# Patient Record
Sex: Female | Born: 1960 | Race: Black or African American | Hispanic: No | Smoking: Former smoker
Health system: Southern US, Community
[De-identification: ages and names within clinical notes are randomized; demographics above are authoritative.]

## PROBLEM LIST (undated history)

## (undated) DIAGNOSIS — I10 Essential (primary) hypertension: Secondary | ICD-10-CM

---

## 2014-04-21 ENCOUNTER — Emergency Department (HOSPITAL_COMMUNITY): Payer: Medicaid Other

## 2014-04-21 ENCOUNTER — Inpatient Hospital Stay (HOSPITAL_COMMUNITY): Payer: Medicaid Other

## 2014-04-21 ENCOUNTER — Encounter (HOSPITAL_COMMUNITY): Payer: Self-pay | Admitting: Pulmonary Disease

## 2014-04-21 ENCOUNTER — Inpatient Hospital Stay (HOSPITAL_COMMUNITY)
Admission: EM | Admit: 2014-04-21 | Discharge: 2014-05-21 | DRG: 064 | Disposition: E | Payer: Medicaid Other | Attending: Neurology | Admitting: Neurology

## 2014-04-21 DIAGNOSIS — A419 Sepsis, unspecified organism: Secondary | ICD-10-CM | POA: Diagnosis not present

## 2014-04-21 DIAGNOSIS — G911 Obstructive hydrocephalus: Secondary | ICD-10-CM | POA: Diagnosis present

## 2014-04-21 DIAGNOSIS — J9600 Acute respiratory failure, unspecified whether with hypoxia or hypercapnia: Secondary | ICD-10-CM | POA: Diagnosis not present

## 2014-04-21 DIAGNOSIS — G936 Cerebral edema: Secondary | ICD-10-CM | POA: Diagnosis present

## 2014-04-21 DIAGNOSIS — Z515 Encounter for palliative care: Secondary | ICD-10-CM

## 2014-04-21 DIAGNOSIS — I619 Nontraumatic intracerebral hemorrhage, unspecified: Secondary | ICD-10-CM | POA: Diagnosis present

## 2014-04-21 DIAGNOSIS — E870 Hyperosmolality and hypernatremia: Secondary | ICD-10-CM | POA: Diagnosis not present

## 2014-04-21 DIAGNOSIS — G9389 Other specified disorders of brain: Secondary | ICD-10-CM | POA: Diagnosis present

## 2014-04-21 DIAGNOSIS — Z87891 Personal history of nicotine dependence: Secondary | ICD-10-CM | POA: Diagnosis not present

## 2014-04-21 DIAGNOSIS — R1310 Dysphagia, unspecified: Secondary | ICD-10-CM | POA: Diagnosis present

## 2014-04-21 DIAGNOSIS — R29898 Other symptoms and signs involving the musculoskeletal system: Secondary | ICD-10-CM | POA: Diagnosis present

## 2014-04-21 DIAGNOSIS — R000 Tachycardia, unspecified: Secondary | ICD-10-CM | POA: Diagnosis present

## 2014-04-21 DIAGNOSIS — Z66 Do not resuscitate: Secondary | ICD-10-CM | POA: Diagnosis not present

## 2014-04-21 DIAGNOSIS — G935 Compression of brain: Secondary | ICD-10-CM | POA: Diagnosis present

## 2014-04-21 DIAGNOSIS — R6520 Severe sepsis without septic shock: Secondary | ICD-10-CM

## 2014-04-21 DIAGNOSIS — R7309 Other abnormal glucose: Secondary | ICD-10-CM | POA: Diagnosis present

## 2014-04-21 DIAGNOSIS — I161 Hypertensive emergency: Secondary | ICD-10-CM

## 2014-04-21 DIAGNOSIS — I10 Essential (primary) hypertension: Secondary | ICD-10-CM | POA: Diagnosis present

## 2014-04-21 DIAGNOSIS — J690 Pneumonitis due to inhalation of food and vomit: Secondary | ICD-10-CM | POA: Diagnosis not present

## 2014-04-21 DIAGNOSIS — R569 Unspecified convulsions: Secondary | ICD-10-CM | POA: Diagnosis present

## 2014-04-21 DIAGNOSIS — R4020 Unspecified coma: Secondary | ICD-10-CM | POA: Diagnosis not present

## 2014-04-21 HISTORY — DX: Essential (primary) hypertension: I10

## 2014-04-21 LAB — I-STAT CHEM 8, ED
BUN: 28 mg/dL — ABNORMAL HIGH (ref 6–23)
CALCIUM ION: 1.38 mmol/L — AB (ref 1.12–1.23)
CREATININE: 1.1 mg/dL (ref 0.50–1.10)
Chloride: 107 mEq/L (ref 96–112)
Glucose, Bld: 200 mg/dL — ABNORMAL HIGH (ref 70–99)
HCT: 46 % (ref 36.0–46.0)
HEMOGLOBIN: 15.6 g/dL — AB (ref 12.0–15.0)
Potassium: 4.2 mEq/L (ref 3.7–5.3)
SODIUM: 142 meq/L (ref 137–147)
TCO2: 21 mmol/L (ref 0–100)

## 2014-04-21 LAB — POCT I-STAT 3, ART BLOOD GAS (G3+)
ACID-BASE DEFICIT: 3 mmol/L — AB (ref 0.0–2.0)
Bicarbonate: 22.8 mEq/L (ref 20.0–24.0)
O2 Saturation: 96 %
PO2 ART: 83 mmHg (ref 80.0–100.0)
Patient temperature: 98.6
TCO2: 24 mmol/L (ref 0–100)
pCO2 arterial: 40.9 mmHg (ref 35.0–45.0)
pH, Arterial: 7.353 (ref 7.350–7.450)

## 2014-04-21 LAB — GLUCOSE, CAPILLARY
GLUCOSE-CAPILLARY: 148 mg/dL — AB (ref 70–99)
Glucose-Capillary: 130 mg/dL — ABNORMAL HIGH (ref 70–99)

## 2014-04-21 LAB — URINE MICROSCOPIC-ADD ON

## 2014-04-21 LAB — URINALYSIS, ROUTINE W REFLEX MICROSCOPIC
Bilirubin Urine: NEGATIVE
Glucose, UA: NEGATIVE mg/dL
Ketones, ur: NEGATIVE mg/dL
LEUKOCYTES UA: NEGATIVE
Nitrite: NEGATIVE
PH: 6 (ref 5.0–8.0)
Protein, ur: >300 mg/dL — AB
SPECIFIC GRAVITY, URINE: 1.031 — AB (ref 1.005–1.030)
UROBILINOGEN UA: 0.2 mg/dL (ref 0.0–1.0)

## 2014-04-21 LAB — APTT: aPTT: 24 seconds (ref 24–37)

## 2014-04-21 LAB — CBC
HEMATOCRIT: 41.3 % (ref 36.0–46.0)
HEMOGLOBIN: 13.4 g/dL (ref 12.0–15.0)
MCH: 24.4 pg — AB (ref 26.0–34.0)
MCHC: 32.4 g/dL (ref 30.0–36.0)
MCV: 75.2 fL — ABNORMAL LOW (ref 78.0–100.0)
Platelets: 345 10*3/uL (ref 150–400)
RBC: 5.49 MIL/uL — ABNORMAL HIGH (ref 3.87–5.11)
RDW: 20.9 % — ABNORMAL HIGH (ref 11.5–15.5)
WBC: 14.1 10*3/uL — ABNORMAL HIGH (ref 4.0–10.5)

## 2014-04-21 LAB — COMPREHENSIVE METABOLIC PANEL
ALT: 16 U/L (ref 0–35)
AST: 29 U/L (ref 0–37)
Albumin: 3.5 g/dL (ref 3.5–5.2)
Alkaline Phosphatase: 106 U/L (ref 39–117)
BILIRUBIN TOTAL: 0.3 mg/dL (ref 0.3–1.2)
BUN: 20 mg/dL (ref 6–23)
CALCIUM: 10.6 mg/dL — AB (ref 8.4–10.5)
CHLORIDE: 101 meq/L (ref 96–112)
CO2: 17 mEq/L — ABNORMAL LOW (ref 19–32)
CREATININE: 0.78 mg/dL (ref 0.50–1.10)
GFR calc non Af Amer: >90 mL/min (ref >90)
GLUCOSE: 165 mg/dL — AB (ref 70–99)
Potassium: 2.9 mEq/L — CL (ref 3.7–5.3)
Sodium: 141 mEq/L (ref 137–147)
Total Protein: 8.8 g/dL — ABNORMAL HIGH (ref 6.0–8.3)

## 2014-04-21 LAB — CBG MONITORING, ED: GLUCOSE-CAPILLARY: 163 mg/dL — AB (ref 70–99)

## 2014-04-21 LAB — DIFFERENTIAL
BASOS PCT: 0 % (ref 0–1)
Basophils Absolute: 0 10*3/uL (ref 0.0–0.1)
EOS ABS: 0.6 10*3/uL (ref 0.0–0.7)
Eosinophils Relative: 4 % (ref 0–5)
Lymphocytes Relative: 50 % — ABNORMAL HIGH (ref 12–46)
Lymphs Abs: 7 10*3/uL — ABNORMAL HIGH (ref 0.7–4.0)
MONO ABS: 1.1 10*3/uL — AB (ref 0.1–1.0)
Monocytes Relative: 8 % (ref 3–12)
Neutro Abs: 5.4 10*3/uL (ref 1.7–7.7)
Neutrophils Relative %: 38 % — ABNORMAL LOW (ref 43–77)

## 2014-04-21 LAB — I-STAT TROPONIN, ED
Troponin i, poc: 0.01 ng/mL (ref 0.00–0.08)
Troponin i, poc: 0.02 ng/mL (ref 0.00–0.08)

## 2014-04-21 LAB — PROTIME-INR
INR: 0.97 (ref 0.00–1.49)
Prothrombin Time: 12.7 seconds (ref 11.6–15.2)

## 2014-04-21 LAB — MRSA PCR SCREENING: MRSA BY PCR: NEGATIVE

## 2014-04-21 MED ORDER — NICARDIPINE HCL IN NACL 20-0.86 MG/200ML-% IV SOLN
5.0000 mg/h | Freq: Once | INTRAVENOUS | Status: AC
  Administered 2014-04-21: 5 mg/h via INTRAVENOUS

## 2014-04-21 MED ORDER — ACETAMINOPHEN 650 MG RE SUPP
650.0000 mg | RECTAL | Status: DC | PRN
  Administered 2014-04-23: 650 mg via RECTAL
  Filled 2014-04-21: qty 1

## 2014-04-21 MED ORDER — BIOTENE DRY MOUTH MT LIQD
15.0000 mL | Freq: Four times a day (QID) | OROMUCOSAL | Status: DC
  Administered 2014-04-22 – 2014-04-25 (×16): 15 mL via OROMUCOSAL

## 2014-04-21 MED ORDER — NICARDIPINE HCL IN NACL 40-0.83 MG/200ML-% IV SOLN
3.0000 mg/h | INTRAVENOUS | Status: DC
  Administered 2014-04-21: 15 mg/h via INTRAVENOUS
  Administered 2014-04-22: 10 mg/h via INTRAVENOUS
  Administered 2014-04-22: 9 mg/h via INTRAVENOUS
  Administered 2014-04-22: 10 mg/h via INTRAVENOUS
  Administered 2014-04-22: 6 mg/h via INTRAVENOUS
  Administered 2014-04-23: 11 mg/h via INTRAVENOUS
  Administered 2014-04-23: 3 mg/h via INTRAVENOUS
  Administered 2014-04-23: 15 mg/h via INTRAVENOUS
  Administered 2014-04-23: 13 mg/h via INTRAVENOUS
  Administered 2014-04-24: 7.5 mg/h via INTRAVENOUS
  Administered 2014-04-24: 7 mg/h via INTRAVENOUS
  Administered 2014-04-24 (×2): 15 mg/h via INTRAVENOUS
  Filled 2014-04-21 (×14): qty 200

## 2014-04-21 MED ORDER — LORAZEPAM 2 MG/ML IJ SOLN
INTRAMUSCULAR | Status: AC | PRN
  Administered 2014-04-21 (×2): 2 mg via INTRAVENOUS

## 2014-04-21 MED ORDER — ETOMIDATE 2 MG/ML IV SOLN: 20.0000 mg | Freq: Once | INTRAVENOUS | Status: DC

## 2014-04-21 MED ORDER — LABETALOL HCL 5 MG/ML IV SOLN: 10.0000 mg | INTRAVENOUS | Status: DC | PRN

## 2014-04-21 MED ORDER — SODIUM CHLORIDE 0.9 % IV SOLN
500.0000 mg | Freq: Two times a day (BID) | INTRAVENOUS | Status: DC
  Administered 2014-04-21 – 2014-04-22 (×2): 500 mg via INTRAVENOUS
  Filled 2014-04-21 (×4): qty 5

## 2014-04-21 MED ORDER — PROPOFOL 10 MG/ML IV EMUL
5.0000 ug/kg/min | Freq: Once | INTRAVENOUS | Status: DC
  Filled 2014-04-21: qty 100

## 2014-04-21 MED ORDER — SUCCINYLCHOLINE CHLORIDE 20 MG/ML IJ SOLN
100.0000 mg | Freq: Once | INTRAMUSCULAR | Status: DC
  Filled 2014-04-21: qty 5

## 2014-04-21 MED ORDER — LABETALOL HCL 5 MG/ML IV SOLN
10.0000 mg | INTRAVENOUS | Status: DC | PRN
  Administered 2014-04-23 – 2014-04-24 (×2): 20 mg via INTRAVENOUS
  Filled 2014-04-21 (×2): qty 4

## 2014-04-21 MED ORDER — NICARDIPINE HCL IN NACL 20-0.86 MG/200ML-% IV SOLN
3.0000 mg/h | INTRAVENOUS | Status: DC
  Administered 2014-04-21 (×2): 15 mg/h via INTRAVENOUS
  Filled 2014-04-21 (×2): qty 200

## 2014-04-21 MED ORDER — METOPROLOL TARTRATE 1 MG/ML IV SOLN
2.5000 mg | INTRAVENOUS | Status: DC | PRN
  Administered 2014-04-21: 2.5 mg via INTRAVENOUS
  Administered 2014-04-21: 5 mg via INTRAVENOUS
  Administered 2014-04-21: 2.5 mg via INTRAVENOUS
  Administered 2014-04-22 – 2014-04-24 (×11): 5 mg via INTRAVENOUS
  Filled 2014-04-21 (×13): qty 5

## 2014-04-21 MED ORDER — PROPOFOL 10 MG/ML IV EMUL
INTRAVENOUS | Status: AC
  Filled 2014-04-21: qty 100

## 2014-04-21 MED ORDER — PROPOFOL 10 MG/ML IV BOLUS: 50.0000 mg | Freq: Once | INTRAVENOUS | Status: DC

## 2014-04-21 MED ORDER — ETOMIDATE 2 MG/ML IV SOLN
INTRAVENOUS | Status: AC
  Administered 2014-04-21: 20 mg
  Filled 2014-04-21: qty 20

## 2014-04-21 MED ORDER — FENTANYL CITRATE 0.05 MG/ML IJ SOLN
INTRAMUSCULAR | Status: AC
  Filled 2014-04-21: qty 2

## 2014-04-21 MED ORDER — ROCURONIUM BROMIDE 50 MG/5ML IV SOLN
INTRAVENOUS | Status: AC
  Filled 2014-04-21: qty 2

## 2014-04-21 MED ORDER — PANTOPRAZOLE SODIUM 40 MG IV SOLR
40.0000 mg | Freq: Every day | INTRAVENOUS | Status: DC
  Administered 2014-04-21: 40 mg via INTRAVENOUS
  Filled 2014-04-21 (×2): qty 40

## 2014-04-21 MED ORDER — ACETAMINOPHEN 325 MG PO TABS
650.0000 mg | ORAL_TABLET | ORAL | Status: DC | PRN
  Administered 2014-04-24 – 2014-04-25 (×2): 650 mg via ORAL
  Filled 2014-04-21 (×2): qty 2

## 2014-04-21 MED ORDER — LIDOCAINE HCL (CARDIAC) 20 MG/ML IV SOLN
INTRAVENOUS | Status: AC
  Filled 2014-04-21: qty 5

## 2014-04-21 MED ORDER — HYDRALAZINE HCL 20 MG/ML IJ SOLN: 10.0000 mg | INTRAMUSCULAR | Status: DC | PRN

## 2014-04-21 MED ORDER — SODIUM CHLORIDE 0.9 % IV SOLN
INTRAVENOUS | Status: DC
  Administered 2014-04-21 – 2014-04-23 (×5): via INTRAVENOUS

## 2014-04-21 MED ORDER — SUCCINYLCHOLINE CHLORIDE 20 MG/ML IJ SOLN
INTRAMUSCULAR | Status: AC
  Administered 2014-04-21: 20 mg
  Filled 2014-04-21: qty 1

## 2014-04-21 MED ORDER — FAMOTIDINE IN NACL 20-0.9 MG/50ML-% IV SOLN
20.0000 mg | Freq: Two times a day (BID) | INTRAVENOUS | Status: DC
  Administered 2014-04-21 – 2014-04-25 (×8): 20 mg via INTRAVENOUS
  Filled 2014-04-21 (×10): qty 50

## 2014-04-21 MED ORDER — INSULIN ASPART 100 UNIT/ML ~~LOC~~ SOLN
0.0000 [IU] | SUBCUTANEOUS | Status: DC
Start: 2014-04-21 — End: 2014-04-25
  Administered 2014-04-21 – 2014-04-22 (×3): 1 [IU] via SUBCUTANEOUS
  Administered 2014-04-22 (×2): 2 [IU] via SUBCUTANEOUS
  Administered 2014-04-22 – 2014-04-24 (×11): 1 [IU] via SUBCUTANEOUS
  Administered 2014-04-24: 2 [IU] via SUBCUTANEOUS
  Administered 2014-04-24: 1 [IU] via SUBCUTANEOUS
  Administered 2014-04-24 – 2014-04-25 (×2): 2 [IU] via SUBCUTANEOUS
  Administered 2014-04-25 (×2): 1 [IU] via SUBCUTANEOUS

## 2014-04-21 MED ORDER — CHLORHEXIDINE GLUCONATE 0.12 % MT SOLN
15.0000 mL | Freq: Two times a day (BID) | OROMUCOSAL | Status: DC
  Administered 2014-04-21 – 2014-04-25 (×8): 15 mL via OROMUCOSAL
  Filled 2014-04-21 (×8): qty 15

## 2014-04-21 MED ORDER — NICARDIPINE HCL IN NACL 20-0.86 MG/200ML-% IV SOLN
5.0000 mg/h | Freq: Once | INTRAVENOUS | Status: AC
  Administered 2014-04-21: 12.5 mg/h via INTRAVENOUS
  Filled 2014-04-21: qty 200

## 2014-04-21 MED ORDER — PROPOFOL 10 MG/ML IV EMUL
5.0000 ug/kg/min | INTRAVENOUS | Status: DC
  Administered 2014-04-21: 40 ug/kg/min via INTRAVENOUS
  Administered 2014-04-21 (×2): 70 ug/kg/min via INTRAVENOUS
  Administered 2014-04-22: 40 ug/kg/min via INTRAVENOUS
  Filled 2014-04-21 (×3): qty 100

## 2014-04-21 MED ORDER — LORAZEPAM 2 MG/ML IJ SOLN
INTRAMUSCULAR | Status: AC
  Filled 2014-04-21: qty 1

## 2014-04-21 MED ORDER — FENTANYL CITRATE 0.05 MG/ML IJ SOLN
25.0000 ug | INTRAMUSCULAR | Status: DC | PRN
  Administered 2014-04-21 – 2014-04-24 (×16): 100 ug via INTRAVENOUS
  Filled 2014-04-21 (×15): qty 2

## 2014-04-21 NOTE — Consult Note (Signed)
Reason for Consult: ICH/IVH Referring Physician: EDP/ neurologist  Courtney Greene is an 53 y.o. female.   HPI:  This is a 53 year old African American female with a history of hypertension who was brought to the emergency department today with mental status changes and right-sided weakness. 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 She is sedated and intubated and unable to cooperate with history and physical. I was consulted about potential ventriculostomy placement for IVH.  Past Medical History  Diagnosis Date  . HTN (hypertension)     History reviewed. No pertinent past surgical history.  No Known Allergies  History  Substance Use Topics  . Smoking status: Former Smoker    Quit date: 12/22/1983  . Smokeless tobacco: Not on file  . Alcohol Use: Not on file    History reviewed. No pertinent family history.   Review of Systems  Positive ROS: Unable to obtain  All other systems have been reviewed and were otherwise negative with the exception of those mentioned in the HPI and as above.  Objective: Vital signs in last 24 hours: Pulse Rate:  [132-151] 148 (05/02 1535) Resp:  [19-26] 23 (05/02 1535) BP: (190-227)/(105-198) 190/111 mmHg (05/02 1535) SpO2:  [100 %] 100 % (05/02 1535) FiO2 (%):  [40 %-100 %] 40 % (05/02 1612) Weight:  [78 kg (171 lb 15.3 oz)] 78 kg (171 lb 15.3 oz) (05/02 1535)  General Appearance: Comatose, intubated Head: Normocephalic, without obvious abnormality, atraumatic Eyes: Pupils 1 mm to 2 mm and unreactive     Ears: Normal TM's and external ear canals, both ears Throat: Intubated Neck: Supple, symmetrical, trachea midline Heart: Tachycardic but normal rhythm Abdomen: Soft Extremities: Extremities normal, atraumatic, no cyanosis or edema  NEUROLOGIC:   Mental status: [id number] Motor Exam - minimal movement on the right, withdrawal somewhat on the left Sensory Exam - unable to assess Reflexes:  symmetric Coordination - unable to assess Gait - unable to assess Balance - unable to assess Cranial Nerves: I: smell Not tested  II: visual acuity  OS: na    OD: na  II: visual fields   II: pupils   III,VII: ptosis   III,IV,VI: extraocular muscles    V: mastication   V: facial light touch sensation    V,VII: corneal reflex   present   VII: facial muscle function - upper    VII: facial muscle function - lower   VIII: hearing   IX: soft palate elevation    IX,X: gag reflex  present   XI: trapezius strength    XI: sternocleidomastoid strength   XI: neck flexion strength    XII: tongue strength      Data Review Lab Results  Component Value Date   WBC 14.1* 05/15/2014   HGB 15.6* 04/23/2014   HCT 46.0 05/10/2014   MCV 75.2* 05/02/2014   PLT 345 05/03/2014   Lab Results  Component Value Date   NA 142 05/06/2014   K 4.2 05/08/2014   CL 107 05/13/2014   CO2 17* 05/04/2014   BUN 28* 05/14/2014   CREATININE 1.10 04/26/2014   GLUCOSE 200* 05/10/2014   Lab Results  Component Value Date   INR 0.97 05/07/2014    Radiology: Ct Head (brain) Wo Contrast  04/29/2014   CLINICAL DATA:  Stroke.  EXAM: CT HEAD WITHOUT CONTRAST  TECHNIQUE: Contiguous axial images were obtained from the base of the skull through the vertex without intravenous contrast.  COMPARISON:  None.  FINDINGS:  Large left periventricular/thalamic hemorrhage is present with hemorrhage present also in the third and lateral ventricles. Hemorrhage also present in the fourth ventricle. Diffuse cerebral edema present. Midline shift from left-to-right of approximately 8 mm noted. Ventricular dilatation is present. No acute bony abnormality identified. Opacification right ethmoidal and maxillary sinuses. Opacification of sphenoid sinus.  IMPRESSION: 1. Large left periventricular, left thalamic hemorrhage with rupture into the ventricles. A large amount of blood is noted in the third, lateral, and fourth ventricles. Midline shift from left-to-right of  approximately 8 mm noted. Diffuse cerebral edema present. 2. Opacification of the right frontal, right maxillary, and sphenoid sinuses consistent with sinusitis. These results were called by telephone at the time of interpretation on 04/23/2014 at 2:51 PM to Dr. Susy FrizzleHARLES SHELDON , who verbally acknowledged these results.   Electronically Signed   By: Maisie Fushomas  Register   On: 04/30/2014 14:53   Dg Chest Port 1 View  05/13/2014   CLINICAL DATA:  Trauma.  Intubated.  Patient is unconscious.  EXAM: PORTABLE CHEST - 1 VIEW  COMPARISON:  None.  FINDINGS: Endotracheal tube tip lies 3.3 cm above the carina, well positioned.  Lungs are clear. No gross pneumothorax on this semi-erect study. No pleural effusion.  Cardiac silhouette is normal in size. No mediastinal widening. The mediastinal or hilar masses.  Bony thorax is intact.  No fracture is seen.  IMPRESSION: No acute cardiopulmonary disease.  ET tube well positioned.   Electronically Signed   By: Amie Portlandavid  Ormond M.D.   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 Studies would suggest that the morbidity and mortality with intraventricular hemorrhage is quite high. She does not have hydrocephalus at this time but her ventricles are casted with blood in it is certainly likely that it will develop. I have spoken to the family at length including her son and daughter. I've explained the current situation and the findings on the films. I tried as were all of their questions to the best of my ability. I think we have 3 options regarding the ventriculostomy drain. 1. We could choose to not put in a drain at all for now and wait for followup imaging to see if ventriculomegaly develops. Placement of the drain would likely lead to occlusion of the drain as is so common in this situation. Then it becomes or risk of infection. Obviously  there is no utility of the drain if it is not draining. 2. We could place a ventriculostomy drain to drain CSF for as long as it can stay patent this would help lower intracranial pressure in the short term. It potentially could stay patent and reduce intracranial pressure, more often these ventriculostomies become clotted with blood in this situation. 3. We could place 1 or 2 ventriculostomy drains and treat with intraventricular tPA. This may help clear the ventricles of blood but certainly has risk of worsening the intracerebral hemorrhage. This is a high risk however were technique that if it clears the intraventricular blood and may improve morbidity and mortality, but if it extended her hemorrhage it could be devastating.  All of this has been explained to the family at length. At this point they have not made a decision what they would like to do. I think it is reasonable to wait for our next CT scan to see if ventricular enlargement occurs. There is certainly some chance that  she will not develop ventriculomegaly despite the intraventricular hemorrhage. We have seen this before. We should repeat her head CT in the morning. We will repeat his sooner should she have a change in neurologic status. We'll continue to monitor her blood pressure and treat it accordingly. We'll keep her head of bed elevated. She will be managed by the neurologist and the critical care team.   Tia Alert 05/16/14 6:20 PM

## 2014-04-21 NOTE — Progress Notes (Signed)
Chaplain requested to offer support to the family. Presented to patient's son, daughter, and daughter's finance in consultation room B. Provided ministry of hospitality and refreshments. Confirmed that physician and RN knew of family's location and was present with family during physician's medical update, providing emotional support, pastoral presence and prayer. Escorted family to sit with patient in Trauma C. Patient's daughter said she is "very scared" and that "this is all the family she has." She also said there is more family out of state. Chaplain provided emotional support to her through empathic listening, pastoral presence, and prayer. Family is waiting with patient to be escorted upstairs. Please page chaplain for additional emotional/spiritual support.   Maurene CapesHillary D Irusta [phone]

## 2014-04-21 NOTE — Code Documentation (Signed)
Code stroke called at 1354, [facility] ED via [facility] at 1402.  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  AS per EMS patient was communicating Stated she had a Left sided headache and then became unresponsive.  Posturing noted on right arm, flaccid right leg.  BP in truck 300/150 via EMS, On arrival BP 200/164.  Patient on CT table when patient started to vomit  And seize, at this time patient taken off table and brought back to ED for intubation by EDP.  CT scan + large bleed, NS and CCM have been consulted

## 2014-04-21 NOTE — H&P (Signed)
Admission H&P    Chief Complaint: Acute large left thalamic cerebral hemorrhage with ventricular extension.  HPI: Courtney Greene is an 53 y.o. female with a history of hypertension who was brought to the emergency room and code stroke status following onset of right-sided weakness followed by increasing obtundation and onset of generalized seizure activity. She was last known well at 1:30 PM today. On arrival in the emergency room she was unresponsive and exhibiting generalized seizure activity as well as nausea and vomiting. Patient was intubated for airway protection and placed on mechanical ventilation. She was also given 2 mg of Ativan intravenously with no recurrence of seizure activity. CT scan of her head showed a large left thalamic hemorrhage with extension into lateral ventricles, with a large amount of blood as well and third and fourth ventricles. Blood pressure was markedly elevated. She reportedly had blood pressure of 300/150 in route to the emergency room. Blood pressure recorded in the emergency room was maximum at 227/150. NIH stroke score was 28. Patient had no history of stroke or TIA, no history of seizure activity.  LSN: 1:30 PM on 05/02/201 TPA Given: No: ICH  MRankin: 4   No past medical history on file.  No past surgical history on file.  No family history on file. Social History:  has no tobacco, alcohol, and drug history on file.  Allergies: Allergies not on file   (Not in a hospital admission)  ROS: History obtained from child  General ROS: negative for - chills, fatigue, fever, night sweats, weight gain or weight loss Psychological ROS: negative for - behavioral disorder, hallucinations, memory difficulties, mood swings or suicidal ideation Ophthalmic ROS: negative for - blurry vision, double vision, eye pain or loss of vision ENT ROS: negative for - epistaxis, nasal discharge, oral lesions, sore throat, tinnitus or vertigo Allergy and Immunology ROS: negative  for - hives or itchy/watery eyes Hematological and Lymphatic ROS: negative for - bleeding problems, bruising or swollen lymph nodes Endocrine ROS: negative for - galactorrhea, hair pattern changes, polydipsia/polyuria or temperature intolerance Respiratory ROS: negative for - cough, hemoptysis, shortness of breath or wheezing Cardiovascular ROS: negative for - chest pain, dyspnea on exertion, edema or irregular heartbeat Gastrointestinal ROS: negative for - abdominal pain, diarrhea, hematemesis, nausea/vomiting or stool incontinence Genito-Urinary ROS: negative for - dysuria, hematuria, incontinence or urinary frequency/urgency Musculoskeletal ROS: negative for - joint swelling or muscular weakness Neurological ROS: as noted in HPI Dermatological ROS: negative for rash and skin lesion changes  Physical Examination: Blood pressure 218/113, pulse 141, resp. rate 21, height $RemoveBe'5\' 5"'oLRAuvrZf$  (1.651 m), SpO2 100.00%.  HEENT-  Normocephalic, no lesions, without obvious abnormality.  Normal external eye and conjunctiva.  Normal TM's bilaterally.  Normal auditory canals and external ears. Normal external nose, mucus membranes and septum.  Normal pharynx. Neck supple with no masses, nodes, nodules or enlargement. Cardiovascular - tachycardia with regular rhythm, normal S1 and S2, no murmur or gallop Lungs - chest clear, no wheezing, rales, normal symmetric air entry Abdomen - soft, non-tender; bowel sounds normal; no masses,  no organomegaly Extremities - no joint deformities, effusion, or inflammation, no edema and no skin discoloration  Neurologic Examination: Patient was intubated and on mechanical relation. She was also sedated with propofol and unresponsive to noxious stimuli. Pupils were equal and reacted normally to light. Extraocular movements are absent with oculocephalic maneuvers. No clear facial weakness was noted. Muscle tone was flaccid throughout. She had no abnormal posturing and only minimal  spontaneous movements associated  with coughing reflex. Deep tendon reflexes were trace to 1+ and symmetrical. Plantar responses were flexor bilaterally. Carotid auscultation was normal.  Results for orders placed during the hospital encounter of 04/26/2014 (from the past 48 hour(s))  PROTIME-INR     Status: None   Collection Time    05/19/2014  2:07 PM      Result Value Ref Range   Prothrombin Time 12.7  11.6 - 15.2 seconds   INR 0.97  0.00 - 1.49  APTT     Status: None   Collection Time    04/23/2014  2:07 PM      Result Value Ref Range   aPTT 24  24 - 37 seconds  CBC     Status: Abnormal   Collection Time    05/14/2014  2:07 PM      Result Value Ref Range   WBC 14.1 (*) 4.0 - 10.5 K/uL   RBC 5.49 (*) 3.87 - 5.11 MIL/uL   Hemoglobin 13.4  12.0 - 15.0 g/dL   HCT 41.3  36.0 - 46.0 %   MCV 75.2 (*) 78.0 - 100.0 fL   MCH 24.4 (*) 26.0 - 34.0 pg   MCHC 32.4  30.0 - 36.0 g/dL   RDW 20.9 (*) 11.5 - 15.5 %   Platelets 345  150 - 400 K/uL  DIFFERENTIAL     Status: Abnormal   Collection Time    04/28/2014  2:07 PM      Result Value Ref Range   Neutrophils Relative % 38 (*) 43 - 77 %   Lymphocytes Relative 50 (*) 12 - 46 %   Monocytes Relative 8  3 - 12 %   Eosinophils Relative 4  0 - 5 %   Basophils Relative 0  0 - 1 %   Neutro Abs 5.4  1.7 - 7.7 K/uL   Lymphs Abs 7.0 (*) 0.7 - 4.0 K/uL   Monocytes Absolute 1.1 (*) 0.1 - 1.0 K/uL   Eosinophils Absolute 0.6  0.0 - 0.7 K/uL   Basophils Absolute 0.0  0.0 - 0.1 K/uL   WBC Morphology ATYPICAL LYMPHOCYTES     Comment: ABSOLUTE LYMPHOCYTOSIS   Smear Review PLATELET COUNT CONFIRMED BY SMEAR     Comment: PLATELET CLUMPS NOTED ON SMEAR     LARGE PLATELETS PRESENT  COMPREHENSIVE METABOLIC PANEL     Status: Abnormal   Collection Time    04/28/2014  2:07 PM      Result Value Ref Range   Sodium 141  137 - 147 mEq/L   Potassium 2.9 (*) 3.7 - 5.3 mEq/L   Comment: CRITICAL RESULT CALLED TO, READ BACK BY AND VERIFIED WITH:     T.MITCHELL,RN [id number]  05/13/2014 M.CAMPBELL   Chloride 101  96 - 112 mEq/L   CO2 17 (*) 19 - 32 mEq/L   Glucose, Bld 165 (*) 70 - 99 mg/dL   BUN 20  6 - 23 mg/dL   Creatinine, Ser 0.78  0.50 - 1.10 mg/dL   Calcium 10.6 (*) 8.4 - 10.5 mg/dL   Total Protein 8.8 (*) 6.0 - 8.3 g/dL   Albumin 3.5  3.5 - 5.2 g/dL   AST 29  0 - 37 U/L   ALT 16  0 - 35 U/L   Alkaline Phosphatase 106  39 - 117 U/L   Total Bilirubin 0.3  0.3 - 1.2 mg/dL   GFR calc non Af Amer >90  >90 mL/min   GFR calc Af Amer >90  >90  mL/min   Comment: (NOTE)     The eGFR has been calculated using the CKD EPI equation.     This calculation has not been validated in all clinical situations.     eGFR's persistently <90 mL/min signify possible Chronic Kidney     Disease.  Randolm Idol, ED     Status: None   Collection Time    04/30/2014  2:14 PM      Result Value Ref Range   Troponin i, poc 0.01  0.00 - 0.08 ng/mL   Comment 3            Comment: Due to the release kinetics of cTnI,     a negative result within the first hours     of the onset of symptoms does not rule out     myocardial infarction with certainty.     If myocardial infarction is still suspected,     repeat the test at appropriate intervals.  CBG MONITORING, ED     Status: Abnormal   Collection Time    05/11/2014  2:20 PM      Result Value Ref Range   Glucose-Capillary 163 (*) 70 - 99 mg/dL   Comment 1 Documented in Chart     Comment 2 Notify RN    Randolm Idol, ED     Status: None   Collection Time    05/20/2014  2:30 PM      Result Value Ref Range   Troponin i, poc 0.02  0.00 - 0.08 ng/mL   Comment 3            Comment: Due to the release kinetics of cTnI,     a negative result within the first hours     of the onset of symptoms does not rule out     myocardial infarction with certainty.     If myocardial infarction is still suspected,     repeat the test at appropriate intervals.  I-STAT CHEM 8, ED     Status: Abnormal   Collection Time    05/04/2014  2:32 PM       Result Value Ref Range   Sodium 142  137 - 147 mEq/L   Potassium 4.2  3.7 - 5.3 mEq/L   Chloride 107  96 - 112 mEq/L   BUN 28 (*) 6 - 23 mg/dL   Creatinine, Ser 1.10  0.50 - 1.10 mg/dL   Glucose, Bld 200 (*) 70 - 99 mg/dL   Calcium, Ion 1.38 (*) 1.12 - 1.23 mmol/L   TCO2 21  0 - 100 mmol/L   Hemoglobin 15.6 (*) 12.0 - 15.0 g/dL   HCT 46.0  36.0 - 46.0 %   Ct Head (brain) Wo Contrast  05/11/2014   CLINICAL DATA:  Stroke.  EXAM: CT HEAD WITHOUT CONTRAST  TECHNIQUE: Contiguous axial images were obtained from the base of the skull through the vertex without intravenous contrast.  COMPARISON:  None.  FINDINGS: Large left periventricular/thalamic hemorrhage is present with hemorrhage present also in the third and lateral ventricles. Hemorrhage also present in the fourth ventricle. Diffuse cerebral edema present. Midline shift from left-to-right of approximately 8 mm noted. Ventricular dilatation is present. No acute bony abnormality identified. Opacification right ethmoidal and maxillary sinuses. Opacification of sphenoid sinus.  IMPRESSION: 1. Large left periventricular, left thalamic hemorrhage with rupture into the ventricles. A large amount of blood is noted in the third, lateral, and fourth ventricles. Midline shift from left-to-right of approximately 8 mm  noted. Diffuse cerebral edema present. 2. Opacification of the right frontal, right maxillary, and sphenoid sinuses consistent with sinusitis. These results were called by telephone at the time of interpretation on 05/18/2014 at 2:51 PM to Dr. Calvert Cantor , who verbally acknowledged these results.   Electronically Signed   By: [place]   On: 04/26/2014 14:53   Dg Chest Port 1 View  05/03/2014   CLINICAL DATA:  Trauma.  Intubated.  Patient is unconscious.  EXAM: PORTABLE CHEST - 1 VIEW  COMPARISON:  None.  FINDINGS: Endotracheal tube tip lies 3.3 cm above the carina, well positioned.  Lungs are clear. No gross pneumothorax on this semi-erect  study. No pleural effusion.  Cardiac silhouette is normal in size. No mediastinal widening. The mediastinal or hilar masses.  Bony thorax is intact.  No fracture is seen.  IMPRESSION: No acute cardiopulmonary disease.  ET tube well positioned.   Electronically Signed   By: Lajean Manes M.D.   On: 04/26/2014 15:28    Assessment: 53 y.o. female 53 year old lady presenting with hypertensive crisis as well as acute left large thalamic hemorrhage with extension into the ventricular system and early signs of hydrocephalus.  Stroke Risk Factors - hypertension  Plan: 1. Neurosurgery consultation for right lateral ventriculostomy drain 2. MRI, MRA  of the brain without contrast 3. Keppra 1000 mg loading dose followed by 500 mg every 12 hours IV for seizure control 4. Echocardiogram 5. Carotid dopplers 6. Prophylactic therapy-None 7. Repeat CT scan of the head without contrast at 12 to 24 hours 8. HgbA1c, fasting lipid panel 9. PT consult, OT consult, Speech consult, when feasible  Patient's admission evaluation and management required complex clinical management, including management of severe hypertension and seizures as well as admission to neuro critical care unit and family counseling. Total critical care time was 90 minutes.  C.R. Nicole Kindred, MD [facility] [phone]  04/22/2014, 3:40 PM

## 2014-04-21 NOTE — Consult Note (Signed)
PULMONARY / CRITICAL CARE MEDICINE   Name: Courtney Greene MRN: [id number] DOB: Nov 04, 1961    ADMISSION DATE:  04/22/2014 CONSULTATION DATE:  05/17/2014   REFERRING MD :  Neurology  PRIMARY SERVICE: Neurology   CHIEF COMPLAINT:  AMS / CVA with Acute Respiratory Failure  BRIEF PATIENT DESCRIPTION: 53 y/o F admitted with ICH.    SIGNIFICANT EVENTS: 5/2 - admit with large ICH  STUDIES:  5/02 - CT HEAD >> large left periventricular, L thalamic hemorrhage with rupture into the ventricles.  Blood noted in third, lateral & 4th ventricles. 8 mm midline shift.  R maxillary, frontal & sphenoid sinusitis 5/02 - ECHO >> 5/02 - Carotid Doppler >>  LINES / TUBES: OETT 5/2 >>  CULTURES:   ANTIBIOTICS:   HISTORY OF PRESENT ILLNESS:  53 y/o F with PMH of HTN who presented to [facility] ER on 5/2 via EMS with AMS.  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  She was heard to fall by staff.  EMS activated and initially patient was communicating with EMS complaining of a headache.  She was then noted to have R arm / leg weakness and significantly elevated blood pressure.  ER initial BP was 200/164.  Patient had episode of vomiting and seizure on CT table and was emergently intubated by EDP.  CT of head consistent with large left periventricular, L thalamic hemorrhage with rupture into the ventricles.  Blood noted in third, lateral & 4th ventricles. 8 mm midline shift.  PCCM called for ICU assistance.    PAST MEDICAL HISTORY :  Past Medical History  Diagnosis Date  . HTN (hypertension)    No past surgical history on file. Prior to Admission medications   Not on File   Not on File  FAMILY HISTORY:  No family history on file.  SOCIAL HISTORY:  has no tobacco, alcohol, and drug history on file.  REVIEW OF SYSTEMS:  Unable to complete as pt is altered on vent  SUBJECTIVE:   VITAL SIGNS: Pulse Rate:  [132-151] 148 (05/02 1535) Resp:  [19-26] 23 (05/02 1535) BP:  (190-227)/(105-198) 190/111 mmHg (05/02 1535) SpO2:  [100 %] 100 % (05/02 1535) FiO2 (%):  [100 %] 100 % (05/02 1400) Weight:  [171 lb 15.3 oz (78 kg)] 171 lb 15.3 oz (78 kg) (05/02 1535)  HEMODYNAMICS:    VENTILATOR SETTINGS: Vent Mode:  [-] PRVC FiO2 (%):  [100 %] 100 % Set Rate:  [23 bmp] 23 bmp Vt Set:  [500 mL] 500 mL PEEP:  [5 cmH20] 5 cmH20 Plateau Pressure:  [21 cmH20] 21 cmH20  INTAKE / OUTPUT: Intake/Output   None     PHYSICAL EXAMINATION: General:  wdwn adult female in NAD on vent  Neuro:  Sedate, movement of R arm & BLE to pain, none noted for LUE, Pupils NR HEENT:  OETT, mm pink/moist Cardiovascular:  s1s2 rrr, no m/r/g Lungs:  resp's even/non-labored, lungs bilaterally clear  Abdomen:  Round/soft, bsx4 active  Musculoskeletal:  No acute deformities  Skin:  Warm/dry, no edema   LABS:  CBC  Recent Labs Lab 05/20/2014 1407 05/17/2014 1432  WBC 14.1*  --   HGB 13.4 15.6*  HCT 41.3 46.0  PLT 345  --    Coag's  Recent Labs Lab 05/16/2014 1407  APTT 24  INR 0.97   BMET  Recent Labs Lab 04/20/2014 1407 04/30/2014 1432  NA 141 142  K 2.9* 4.2  CL 101 107  CO2 17*  --   BUN 20 28*  CREATININE 0.78 1.10  GLUCOSE 165* 200*   Electrolytes  Recent Labs Lab 04/23/2014 1407  CALCIUM 10.6*   Sepsis Markers No results found for this basename: LATICACIDVEN, PROCALCITON, O2SATVEN,  in the last 168 hours ABG No results found for this basename: PHART, PCO2ART, PO2ART,  in the last 168 hours Liver Enzymes  Recent Labs Lab 05/15/2014 1407  AST 29  ALT 16  ALKPHOS 106  BILITOT 0.3  ALBUMIN 3.5   Cardiac Enzymes No results found for this basename: TROPONINI, PROBNP,  in the last 168 hours Glucose  Recent Labs Lab 05/13/2014 1420  GLUCAP 163*    Imaging Ct Head (brain) Wo Contrast  05/15/2014   CLINICAL DATA:  Stroke.  EXAM: CT HEAD WITHOUT CONTRAST  TECHNIQUE: Contiguous axial images were obtained from the base of the skull through the vertex  without intravenous contrast.  COMPARISON:  None.  FINDINGS: Large left periventricular/thalamic hemorrhage is present with hemorrhage present also in the third and lateral ventricles. Hemorrhage also present in the fourth ventricle. Diffuse cerebral edema present. Midline shift from left-to-right of approximately 8 mm noted. Ventricular dilatation is present. No acute bony abnormality identified. Opacification right ethmoidal and maxillary sinuses. Opacification of sphenoid sinus.  IMPRESSION: 1. Large left periventricular, left thalamic hemorrhage with rupture into the ventricles. A large amount of blood is noted in the third, lateral, and fourth ventricles. Midline shift from left-to-right of approximately 8 mm noted. Diffuse cerebral edema present. 2. Opacification of the right frontal, right maxillary, and sphenoid sinuses consistent with sinusitis. These results were called by telephone at the time of interpretation on 04/24/2014 at 2:51 PM to Dr. Susy FrizzleHARLES SHELDON , who verbally acknowledged these results.   Electronically Signed   By: Maisie Fushomas  Register   On: 05/04/2014 14:53   Dg Chest Port 1 View  04/24/2014   CLINICAL DATA:  Trauma.  Intubated.  Patient is unconscious.  EXAM: PORTABLE CHEST - 1 VIEW  COMPARISON:  None.  FINDINGS: Endotracheal tube tip lies 3.3 cm above the carina, well positioned.  Lungs are clear. No gross pneumothorax on this semi-erect study. No pleural effusion.  Cardiac silhouette is normal in size. No mediastinal widening. The mediastinal or hilar masses.  Bony thorax is intact.  No fracture is seen.  IMPRESSION: No acute cardiopulmonary disease.  ET tube well positioned.   Electronically Signed   By: Amie Portlandavid  Ormond M.D.   On: 04/29/2014 15:28    ASSESSMENT / PLAN:  NEUROLOGIC A:   ICH  P:   -work up & further imaging per Neurology -seizure precautions  PULMONARY A: Acute respiratory failure - in setting of ICH P:   -vent support, 8cc/kg -f/u abg in one hour -trend  CXR  CARDIOVASCULAR A:  Hypertensive Emergency  Tachycardia  P:  -cardene gtt for BP goal 160/90 -PRN lopressor, labetalol & hydralazine  -propofol for sedation to aide in blood pressure reduction -cycle enzymes  -EKG in am  -assess carotid dopplers, 2D ECHO   RENAL A:   No acute issues, at risk AKI with HTN P:   -monitor BMP   GASTROINTESTINAL A:   Dysphagia - in setting of mechanical ventilation  P:   -Pepcid -NPO -consider TF in am   HEMATOLOGIC A:   No acute issues  P:  -monitor cbc -SCD's for DVT proph  INFECTIOUS A:   No evidence of acute infectious process P:   -monitor fever curve / leukocytosis -PRN  tylenol  ENDOCRINE A:   Hyperglycemia    P:   -SSI    Canary BrimBrandi Ollis, NP-C  Pulmonary & Critical Care Pgr: [phone] or [phone]    I have personally obtained a history, examined the patient, evaluated laboratory and imaging results, formulated the assessment and plan and placed orders.  CRITICAL CARE: The patient is critically ill with multiple organ systems failure and requires high complexity decision making for assessment and support, frequent evaluation and titration of therapies, application of advanced monitoring technologies and extensive interpretation of multiple databases. Critical Care Time devoted to patient care services described in this note is 35 minutes.   Billy Fischeravid Simonds, MD ; [facility]CCM service Mobile [phone].  After 5:30 PM or weekends, call [phone]  05/10/2014, 4:25 PM

## 2014-04-21 NOTE — ED Provider Notes (Addendum)
CSN: [id number]     Arrival date & time 05/07/2014  1402 History   First MD Initiated Contact with Patient 2014/02/13 1407     Chief Complaint  Patient presents with  . Code Stroke    An emergency department physician performed an initial assessment on this suspected stroke patient at 1402. (Consider location/radiation/quality/duration/timing/severity/associated sxs/prior Treatment) HPI Level 5 caveat due to unresponsive Pt brought to ED via EMS from work with sudden onset confusion, R sided weakness and L facial droop. Per their report, initial BP was ~300/150. En route she had what appeared to be some seizure-like activity and/or posturing.   No past medical history on file. No past surgical history on file. No family history on file. History  Substance Use Topics  . Smoking status: Not on file  . Smokeless tobacco: Not on file  . Alcohol Use: Not on file   OB History   No data available     Review of Systems Unable to assess due to mental status.     Allergies  Review of patient's allergies indicates not on file.  Home Medications   Prior to Admission medications   Not on File   BP 217/182  Pulse 137  Resp 22  Ht 5\' 5"  (1.651 m)  SpO2 100% Physical Exam  Nursing note and vitals reviewed. Constitutional: She appears well-developed and well-nourished.  HENT:  Head: Normocephalic and atraumatic.  Eyes: Pupils are equal, round, and reactive to light.  Neck: Neck supple.  Cardiovascular: Normal heart sounds and intact distal pulses.  Tachycardia present.   Pulmonary/Chest: Effort normal and breath sounds normal.  Abdominal: Soft.  Musculoskeletal: She exhibits no edema.  Neurological:  Difficult to fully assess due to mental status, occasional purposeful movements with L hand, but does not respond to verbal stimuli  Skin: Skin is warm and dry. No rash noted.  Psychiatric: She has a normal mood and affect.    ED Course  Procedures (including critical care  time)  Nursing having difficulty securing IV access while seizing. IO line placed in R proximal tibia after betadine scrub, using IO drill. Withdraw blood, flush without difficult.   INTUBATION Performed by: Bonnita Levanharles B. Bernette MayersSheldon  Required items: required blood products, implants, devices, and special equipment available Patient identity confirmed: provided demographic data and hospital-assigned identification number Time out: Immediately prior to procedure a "time out" was called to verify the correct patient, procedure, equipment, support staff and site/side marked as required.  Indications: airway protection  Intubation method: Glidescope Laryngoscopy   Preoxygenation: BVM  Sedatives: Etomidate Paralytic: Succinylcholine  Tube Size: 7.5 cuffed  Post-procedure assessment: chest rise and ETCO2 monitor Breath sounds: equal and absent over the epigastrium Tube secured with: ETT holder Chest x-ray interpreted by radiologist and me.  Chest x-ray findings: endotracheal tube in appropriate position  Patient tolerated the procedure well with no immediate complications.  CRITICAL CARE Performed by: Bonnita Levanharles B. Coury Grieger Total critical care time: 60 Critical care time was exclusive of separately billable procedures and treating other patients. Critical care was necessary to treat or prevent imminent or life-threatening deterioration. Critical care was time spent personally by me on the following activities: development of treatment plan with patient and/or surrogate as well as nursing, discussions with consultants, evaluation of patient's response to treatment, examination of patient, obtaining history from patient or surrogate, ordering and performing treatments and interventions, ordering and review of laboratory studies, ordering and review of radiographic studies, pulse oximetry and re-evaluation of patient's condition.  Labs Review Labs Reviewed  CBC - Abnormal; Notable for the  following:    WBC 14.1 (*)    RBC 5.49 (*)    MCV 75.2 (*)    MCH 24.4 (*)    RDW 20.9 (*)    All other components within normal limits  DIFFERENTIAL - Abnormal; Notable for the following:    Neutrophils Relative % 38 (*)    Lymphocytes Relative 50 (*)    Lymphs Abs 7.0 (*)    Monocytes Absolute 1.1 (*)    All other components within normal limits  COMPREHENSIVE METABOLIC PANEL - Abnormal; Notable for the following:    Potassium 2.9 (*)    CO2 17 (*)    Glucose, Bld 165 (*)    Calcium 10.6 (*)    Total Protein 8.8 (*)    All other components within normal limits  CBG MONITORING, ED - Abnormal; Notable for the following:    Glucose-Capillary 163 (*)    All other components within normal limits  I-STAT CHEM 8, ED - Abnormal; Notable for the following:    BUN 28 (*)    Glucose, Bld 200 (*)    Calcium, Ion 1.38 (*)    Hemoglobin 15.6 (*)    All other components within normal limits  PROTIME-INR  APTT  I-STAT TROPOININ, ED  I-STAT TROPOININ, ED    Imaging Review Ct Head (brain) Wo Contrast  05/15/2014   CLINICAL DATA:  Stroke.  EXAM: CT HEAD WITHOUT CONTRAST  TECHNIQUE: Contiguous axial images were obtained from the base of the skull through the vertex without intravenous contrast.  COMPARISON:  None.  FINDINGS: Large left periventricular/thalamic hemorrhage is present with hemorrhage present also in the third and lateral ventricles. Hemorrhage also present in the fourth ventricle. Diffuse cerebral edema present. Midline shift from left-to-right of approximately 8 mm noted. Ventricular dilatation is present. No acute bony abnormality identified. Opacification right ethmoidal and maxillary sinuses. Opacification of sphenoid sinus.  IMPRESSION: 1. Large left periventricular, left thalamic hemorrhage with rupture into the ventricles. A large amount of blood is noted in the third, lateral, and fourth ventricles. Midline shift from left-to-right of approximately 8 mm noted. Diffuse cerebral  edema present. 2. Opacification of the right frontal, right maxillary, and sphenoid sinuses consistent with sinusitis. These results were called by telephone at the time of interpretation on 05/15/2014 at 2:51 PM to Dr. Susy Frizzle , who verbally acknowledged these results.   Electronically Signed   By: Maisie Fus  Register   On: 05/18/2014 14:53   Dg Chest Port 1 View  04/29/2014   CLINICAL DATA:  Trauma.  Intubated.  Patient is unconscious.  EXAM: PORTABLE CHEST - 1 VIEW  COMPARISON:  None.  FINDINGS: Endotracheal tube tip lies 3.3 cm above the carina, well positioned.  Lungs are clear. No gross pneumothorax on this semi-erect study. No pleural effusion.  Cardiac silhouette is normal in size. No mediastinal widening. The mediastinal or hilar masses.  Bony thorax is intact.  No fracture is seen.  IMPRESSION: No acute cardiopulmonary disease.  ET tube well positioned.   Electronically Signed   By: Amie Portland M.D.   On: 05/04/2014 15:28     EKG Interpretation   Date/Time:  Saturday Apr 21 2014 14:57:16 EDT Ventricular Rate:  132 PR Interval:  107 QRS Duration: 96 QT Interval:  430 QTC Calculation: 637 R Axis:   30 Text Interpretation:  Sinus tachycardia LAE, consider biatrial enlargement  Left ventricular hypertrophy Abnormal T, consider ischemia,  lateral leads  Prolonged QT interval No old tracing to compare Confirmed by [facility] Hospital[facility]  MD,  Leonette MostHARLES [phone]([id number]) on 04/20/2014 3:01:58 PM      MDM   Final diagnoses:  ICH (intracerebral hemorrhage)  Generalized seizures  Hypertensive emergency    Pt taken immediately to CT but then had posturing and seizure. Moved back to ED for intubation prior to CT. Given Ativan for seizure, Etomidate and succinylcholine for intubation. CT as above shows large bleed. Dr. Roseanne RenoStewart in ED during initial assessment will assume care and has discussed with family. Cardene drip initiated. Dr. Dub Amis. Jones with Neurosurgery consulted for possible ventriculostomy. Dr. Sung AmabileSimonds on  call for PCCM consulted for vent management.     Claus Silvestro B. Bernette MayersSheldon, MD 04/23/2014 1619  Bonnita Levanharles B. Bernette MayersSheldon, MD 04/23/2014 2315

## 2014-04-21 NOTE — Sedation Documentation (Signed)
CBG=163 mg/dl

## 2014-04-21 NOTE — Progress Notes (Signed)
Patient came in via EMS due to fall and unresponsiveness, patient ultimately intubated by ED physician with a 7.5 ETT taped at lips, good color change on end tidal CO2 detector good BBS ausculted X-Ray confirmed good placement per ED physician, placed on vent at above settings patient tolerated well.

## 2014-04-21 NOTE — ED Notes (Signed)
CBG is 163. Notified Nurse Felipa Etherrence.

## 2014-04-22 ENCOUNTER — Inpatient Hospital Stay (HOSPITAL_COMMUNITY): Payer: Medicaid Other

## 2014-04-22 LAB — BLOOD GAS, ARTERIAL
Acid-base deficit: 2.9 mmol/L — ABNORMAL HIGH (ref 0.0–2.0)
BICARBONATE: 20.9 meq/L (ref 20.0–24.0)
Drawn by: 13898
FIO2: 0.4 %
MECHVT: 450 mL
O2 Saturation: 97.4 %
PCO2 ART: 33.6 mmHg — AB (ref 35.0–45.0)
PEEP: 5 cmH2O
PH ART: 7.412 (ref 7.350–7.450)
Patient temperature: 99.3
RATE: 20 resp/min
TCO2: 21.9 mmol/L (ref 0–100)
pO2, Arterial: 99 mmHg (ref 80.0–100.0)

## 2014-04-22 LAB — CBC
HEMATOCRIT: 35.1 % — AB (ref 36.0–46.0)
Hemoglobin: 11.2 g/dL — ABNORMAL LOW (ref 12.0–15.0)
MCH: 24 pg — ABNORMAL LOW (ref 26.0–34.0)
MCHC: 31.9 g/dL (ref 30.0–36.0)
MCV: 75.2 fL — AB (ref 78.0–100.0)
Platelets: 248 10*3/uL (ref 150–400)
RBC: 4.67 MIL/uL (ref 3.87–5.11)
RDW: 21.2 % — ABNORMAL HIGH (ref 11.5–15.5)
WBC: 20.3 10*3/uL — ABNORMAL HIGH (ref 4.0–10.5)

## 2014-04-22 LAB — BASIC METABOLIC PANEL
BUN: 15 mg/dL (ref 6–23)
CHLORIDE: 105 meq/L (ref 96–112)
CO2: 20 meq/L (ref 19–32)
Calcium: 9.3 mg/dL (ref 8.4–10.5)
Creatinine, Ser: 0.71 mg/dL (ref 0.50–1.10)
GFR calc Af Amer: >90 mL/min (ref >90)
GFR calc non Af Amer: >90 mL/min (ref >90)
Glucose, Bld: 149 mg/dL — ABNORMAL HIGH (ref 70–99)
POTASSIUM: 3.5 meq/L — AB (ref 3.7–5.3)
SODIUM: 139 meq/L (ref 137–147)

## 2014-04-22 LAB — GLUCOSE, CAPILLARY
GLUCOSE-CAPILLARY: 154 mg/dL — AB (ref 70–99)
Glucose-Capillary: 146 mg/dL — ABNORMAL HIGH (ref 70–99)
Glucose-Capillary: 144 mg/dL — ABNORMAL HIGH (ref 70–99)
Glucose-Capillary: 128 mg/dL — ABNORMAL HIGH (ref 70–99)
Glucose-Capillary: 129 mg/dL — ABNORMAL HIGH (ref 70–99)
Glucose-Capillary: 139 mg/dL — ABNORMAL HIGH (ref 70–99)

## 2014-04-22 MED ORDER — VITAL HIGH PROTEIN PO LIQD
1000.0000 mL | ORAL | Status: DC
  Administered 2014-04-22 (×2)
  Administered 2014-04-22: 1000 mL
  Filled 2014-04-22 (×3): qty 1000

## 2014-04-22 MED ORDER — POTASSIUM CHLORIDE 20 MEQ/15ML (10%) PO LIQD
40.0000 meq | Freq: Once | ORAL | Status: AC
  Administered 2014-04-22: 40 meq
  Filled 2014-04-22: qty 30

## 2014-04-22 MED ORDER — SODIUM CHLORIDE 0.9 % IV SOLN
1000.0000 mg | INTRAVENOUS | Status: AC
  Administered 2014-04-22: 1000 mg via INTRAVENOUS
  Filled 2014-04-22: qty 10

## 2014-04-22 MED ORDER — MIDAZOLAM HCL 2 MG/2ML IJ SOLN
1.0000 mg | INTRAMUSCULAR | Status: DC | PRN
  Administered 2014-04-22 – 2014-04-24 (×4): 2 mg via INTRAVENOUS
  Filled 2014-04-22 (×4): qty 2

## 2014-04-22 MED ORDER — PROPOFOL 10 MG/ML IV EMUL
5.0000 ug/kg/min | INTRAVENOUS | Status: DC
  Administered 2014-04-22: 20 ug/kg/min via INTRAVENOUS
  Administered 2014-04-23: 60 ug/kg/min via INTRAVENOUS
  Administered 2014-04-23: 35 ug/kg/min via INTRAVENOUS
  Administered 2014-04-23: 30 ug/kg/min via INTRAVENOUS
  Administered 2014-04-23 – 2014-04-24 (×3): 60 ug/kg/min via INTRAVENOUS
  Administered 2014-04-24: 70 ug/kg/min via INTRAVENOUS
  Administered 2014-04-24 (×2): 60 ug/kg/min via INTRAVENOUS
  Administered 2014-04-24: 70 ug/kg/min via INTRAVENOUS
  Administered 2014-04-24: 60 ug/kg/min via INTRAVENOUS
  Administered 2014-04-25: 35 ug/kg/min via INTRAVENOUS
  Administered 2014-04-25 (×2): 30 ug/kg/min via INTRAVENOUS
  Filled 2014-04-22 (×16): qty 100

## 2014-04-22 MED ORDER — SODIUM CHLORIDE 0.9 % IV SOLN
1000.0000 mg | Freq: Two times a day (BID) | INTRAVENOUS | Status: DC
  Administered 2014-04-22 – 2014-04-25 (×6): 1000 mg via INTRAVENOUS
  Filled 2014-04-22 (×8): qty 10

## 2014-04-22 NOTE — Progress Notes (Signed)
Patient ID: Courtney CorwinJoanne Teal, female   DOB: 26-Feb-1961, 53 y.o.   MRN: [id number] Subjective: Patient remains stable in regards to her neural arch exam as though she is fairly devastated.  Objective: Vital signs in last 24 hours: Temp:  [99.3 F (37.4 C)-99.5 F (37.5 C)] 99.3 F (37.4 C) (05/03 0400) Pulse Rate:  [101-161] 101 (05/03 0900) Resp:  [16-32] 21 (05/03 0900) BP: (131-227)/(64-198) 131/66 mmHg (05/03 0900) SpO2:  [98 %-100 %] 100 % (05/03 0900) FiO2 (%):  [40 %-100 %] 40 % (05/03 0900) Weight:  [78 kg (171 lb 15.3 oz)] 78 kg (171 lb 15.3 oz) (05/02 1535)  Intake/Output from previous day: 05/02 0701 - 05/03 0700 In: 2929.8 [I.V.:2774.8; IV Piggyback:155] Out: 1500 [Urine:1500] Intake/Output this shift: Total I/O In: 353.3 [I.V.:248.3; IV Piggyback:105] Out: -   She flexes some left greater than right, pupils remain 1-2 mm and nonreactive, she has corneal reflexes and a gag, she continues to have a moribund appearance  Lab Results: Lab Results  Component Value Date   WBC 20.3* 04/22/2014   HGB 11.2* 04/22/2014   HCT 35.1* 04/22/2014   MCV 75.2* 04/22/2014   PLT 248 04/22/2014   Lab Results  Component Value Date   INR 0.97 04/22/2014   BMET Lab Results  Component Value Date   NA 139 04/22/2014   K 3.5* 04/22/2014   CL 105 04/22/2014   CO2 20 04/22/2014   GLUCOSE 149* 04/22/2014   BUN 15 04/22/2014   CREATININE 0.71 04/22/2014   CALCIUM 9.3 04/22/2014    Studies/Results: Ct Head Wo Contrast  04/22/2014   CLINICAL DATA:  Followup intracranial hemorrhage.  EXAM: CT HEAD WITHOUT CONTRAST  TECHNIQUE: Contiguous axial images were obtained from the base of the skull through the vertex without intravenous contrast.  COMPARISON:  Prior CT from 01-20-14  FINDINGS: Again seen is a large left periventricular/thalamic hemorrhage with intraventricular extension into the lateral, third, and fourth ventricles. Overall, the degree of intraventricular hemorrhage is slightly decreased relative to prior  study, compatible with redistribution. Ventricular dilatation not significantly changed. Midline shift of approximately 7 mm from left to right is not significantly changed. Diffuse cerebral edema again noted.  Opacification of the to right ethmoidal air cells, right sphenoid sinus, and right maxillary sinus again noted. Endotracheal tube partially visualized. Fluid density within the nasopharynx likely related intubation  IMPRESSION: 1. Large left periventricular/ left thalamic hemorrhage with intraventricular extension. Overall volume of intraventricular hemorrhage is slightly decreased relative to 01-20-14, compatible with redistribution. Stable ventricular dilatation. 2. Left-to-right midline shift of approximately 7 mm, not significantly changed.   Electronically Signed   By: Rise MuBenjamin  McClintock M.D.   On: 04/22/2014 07:10   Ct Head (brain) Wo Contrast  04/20/2014   CLINICAL DATA:  Stroke.  EXAM: CT HEAD WITHOUT CONTRAST  TECHNIQUE: Contiguous axial images were obtained from the base of the skull through the vertex without intravenous contrast.  COMPARISON:  None.  FINDINGS: Large left periventricular/thalamic hemorrhage is present with hemorrhage present also in the third and lateral ventricles. Hemorrhage also present in the fourth ventricle. Diffuse cerebral edema present. Midline shift from left-to-right of approximately 8 mm noted. Ventricular dilatation is present. No acute bony abnormality identified. Opacification right ethmoidal and maxillary sinuses. Opacification of sphenoid sinus.  IMPRESSION: 1. Large left periventricular, left thalamic hemorrhage with rupture into the ventricles. A large amount of blood is noted in the third, lateral, and fourth ventricles. Midline shift from left-to-right of approximately 8 mm noted. Diffuse  cerebral edema present. 2. Opacification of the right frontal, right maxillary, and sphenoid sinuses consistent with sinusitis. These results were called by telephone at  the time of interpretation on 05/02/2014 at 2:51 PM to Dr. Susy FrizzleHARLES SHELDON , who verbally acknowledged these results.   Electronically Signed   By: Maisie Fushomas  Register   On: 05/11/2014 14:53   Dg Chest Port 1 View  04/22/2014   CLINICAL DATA:  Evaluate ET tube placement  EXAM: PORTABLE CHEST - 1 VIEW  COMPARISON:  04/23/2014  FINDINGS: Endotracheal tube tip is above the carina. There is a nasogastric tube with tip in the stomach. The heart size appears mildly enlarged. No pleural effusion or edema noted.  IMPRESSION: 1. Satisfactory position of ET tube with tip above the carina   Electronically Signed   By: Signa Kellaylor  Stroud M.D.   On: 04/22/2014 07:55   Dg Chest Port 1 View  05/18/2014   CLINICAL DATA:  Trauma.  Intubated.  Patient is unconscious.  EXAM: PORTABLE CHEST - 1 VIEW  COMPARISON:  None.  FINDINGS: Endotracheal tube tip lies 3.3 cm above the carina, well positioned.  Lungs are clear. No gross pneumothorax on this semi-erect study. No pleural effusion.  Cardiac silhouette is normal in size. No mediastinal widening. The mediastinal or hilar masses.  Bony thorax is intact.  No fracture is seen.  IMPRESSION: No acute cardiopulmonary disease.  ET tube well positioned.   Electronically Signed   By: Amie Portlandavid  Ormond M.D.   On: 05/10/2014 15:28   Dg Abd Portable 1v  05/05/2014   CLINICAL DATA:  Orogastric tube placement.  EXAM: PORTABLE ABDOMEN - 1 VIEW  COMPARISON:  None.  FINDINGS: Orogastric tube noted with tip in the region of the distal stomach. No gastric distention noted. No bowel distention or free air. Calcifications within pelvis consistent with phleboliths. No acute bony abnormality. Cardiomegaly.  IMPRESSION: Orogastric tube noted with its tip projected over the distal stomach. No gastric distention .   Electronically Signed   By: Maisie Fushomas  Register   On: 05/08/2014 20:29    Assessment/Plan: CT scan is stable. Neurologic exam is stable. Her ventriculomegaly is not any worse today. In fact she has some  mild clearing of the blood from her ventricles. It is difficult to know whether or not a ventriculostomy would help. It may would in lower ICP but I think the chance of a clotting off is extremely high given my previous experience, achiness utility likely very short lived. Also don't know that will change the morbidity or mortality or ultimate outcome of this hemorrhage. Difficult complex decision making and a very difficult situation,   LOS: 1 day    Tia AlertDavid S Takela Varden 04/22/2014, 9:14 AM

## 2014-04-22 NOTE — Progress Notes (Signed)
PULMONARY / CRITICAL CARE MEDICINE   Name: Courtney CorwinJoanne Greene MRN: [id number] DOB: [birth year]    ADMISSION DATE:  05/08/2014 CONSULTATION DATE:  05/13/2014   REFERRING MD :  Neurology  PRIMARY SERVICE: Neurology   BRIEF PATIENT DESCRIPTION: 53 y/o F admitted to Stroke Service. with thalamic hemorrhagic CVA with IVH, hydrocephalus and cerebral edema.    SIGNIFICANT EVENTS: 5/2 - admit with large ICH  STUDIES:  5/02 - CT HEAD: large left periventricular, L thalamic hemorrhage with rupture into the ventricles.  Blood noted in third, lateral & 4th ventricles. 8 mm midline shift.  R maxillary, frontal & sphenoid sinusitis 5/02 - ECHO: 5/02 - Carotid Doppler: 5/03 CT head: Overall volume of intraventricular hemorrhage is slightly decreased relative to December 20, 2014, compatible with redistribution. Stable ventricular dilatation 5/03 CT head: NSC  LINES / TUBES: ETT 5/2 >>  CULTURES:   ANTIBIOTICS:    SUBJECTIVE:  Comatose  VITAL SIGNS: Temp:  [98.1 F (36.7 C)-99.5 F (37.5 C)] 98.1 F (36.7 C) (05/03 1533) Pulse Rate:  [94-156] 109 (05/03 1900) Resp:  [16-44] 26 (05/03 1900) BP: (113-184)/(64-134) 141/78 mmHg (05/03 1900) SpO2:  [95 %-100 %] 98 % (05/03 1900) FiO2 (%):  [30 %-40 %] 30 % (05/03 1900)  HEMODYNAMICS:    VENTILATOR SETTINGS: Vent Mode:  [-] PRVC FiO2 (%):  [30 %-40 %] 30 % Set Rate:  [20 bmp] 20 bmp Vt Set:  [450 mL] 450 mL PEEP:  [5 cmH20] 5 cmH20 Plateau Pressure:  [16 cmH20-19 cmH20] 19 cmH20  INTAKE / OUTPUT: Intake/Output     05/03 0701 - 05/04 0700   I.V. (mL/kg) 1402.3 (18)   NG/GT 99.3   IV Piggyback 265   Total Intake(mL/kg) 1766.7 (22.6)   Urine (mL/kg/hr) 400 (0.4)   Total Output 400   Net +1366.7         PHYSICAL EXAMINATION: General: Unresponsive Neuro: Comatose, ? posturing HEENT: WNL Cardiovascular: RRR s M Lungs: clear Abdomen: soft, +BS Ext:  Warm, no edema   LABS: I have reviewed all of today's lab results. 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  P:   Further eval and mgmt per Stroke team and NS  PULMONARY A: Acute respiratory failure  P:   Cont full vent support - settings reviewed and/or adjusted Cont vent bundle Daily SBT if/when meets criteria   CARDIOVASCULAR A:  Hypertensive Emergency  - controlled Tachycardia - resolved P:  Cont nicardipine gtt Cont PRN metoprolol, labetalol and hydralazine  RENAL A:   Mild hyperkalemia P:   Monitor BMET intermittently Monitor I/Os Correct electrolytes as indicated  GASTROINTESTINAL A:   No acute issues P:   SUP: Famotidine  Begin TFs 5/03  HEMATOLOGIC A:   No issues P:  DVT px: SQ heparin Monitor CBC intermittently Transfuse per usual ICU guidelines  INFECTIOUS A:   No evidence of acute infectious process P:   -monitor fever curve / leukocytosis   ENDOCRINE A:   Hyperglycemia without prior dx of DM P:   Cont SSI    I have personally obtained a history, examined the patient, evaluated laboratory and imaging results, formulated the assessment and plan and placed orders.  CRITICAL CARE: The patient is critically ill with multiple organ systems failure and requires high complexity decision making for assessment and support, frequent evaluation and titration of therapies, application of advanced monitoring technologies and extensive interpretation  of multiple databases. Critical Care Time devoted to patient care services described in this note is 35 minutes.   Billy Fischeravid Emmons Toth, MD ; [facility] [phone].  After 5:30 PM or weekends, call [phone]  04/22/2014, 7:30 PM

## 2014-04-22 NOTE — Progress Notes (Signed)
eLink Physician-Brief Progress Note Patient Name: Courtney CorwinJoanne Greene DOB: [birth year] MRN: [id number]  Date of Service  04/22/2014   HPI/Events of Note   Freq fent pushes High BP/RR   eICU Interventions  Resume propofol   Intervention Category Major Interventions: Other:  Courtney Greene 04/22/2014, 8:55 PM

## 2014-04-22 NOTE — Progress Notes (Signed)
PT Cancellation Note  Patient Details Name: Courtney CorwinJoanne Greene MRN: [id number] DOB: 08-27-1961   Cancelled Treatment:    Reason Eval/Treat Not Completed: Patient not medically ready. RN deferred this date as pt remains on vent and appears to be demonstrating posturing response to noxious stimuli. PT to return as able/when appropriate.   Jillaine Waren M Ky Moskowitz 04/22/2014, 7:53 AM

## 2014-04-22 NOTE — Progress Notes (Signed)
Pt. transported on ventilator to CT/back-uneventful.

## 2014-04-22 NOTE — Progress Notes (Signed)
Pt. placed to SBT@0812 , rr >'d to 40's, with HR >'ing from 112 to 121 within < 1 min, placed back to full support, RN made aware.

## 2014-04-22 NOTE — Progress Notes (Signed)
SLP Cancellation Note  Patient Details Name: Courtney Greene MRN: [id number] DOB: Jul 30, 1961   Cancelled treatment: Received order for SLE but unable to complete as patient currently on ventilator.  ST to sign off.  Please reorder when warranted.  Courtney FowlerKaren Jeraldean Wechter MS, CCC-SLP [phone] Courtney AxonKaren H [name] 04/22/2014, 12:50 PM

## 2014-04-22 NOTE — Progress Notes (Signed)
Pt RR in 40's, ventilator going off, HR elevated in 140's, SBP 198. Dr. Vassie LollAlva notified that fentanyl pushes had been given frequently and weren't as effective. Order for propofol to be restarted. Will monitor.

## 2014-04-22 NOTE — Progress Notes (Signed)
[facility] FOR [facility] LAB REPLACEMENT ONLY  The patient does not apply for the [facility] Electrolyte Replacment Protocol based on the criteria listed below:   1. Is GFR >/= 40 ml/min? yes  Patient's GFR today is >90 2. Is urine output >/= 0.5 ml/kg/hr for the last 6 hours? no Patient's UOP is 0 ml/kg/hr 3. Is BUN < 60 mg/dL? yes  Patient's BUN today is 15 4. Abnormal electrolyte(s):K+3.5 5. Ordered repletion with: NA 6. If a panic level lab has been reported, has the CCM MD in charge been notified? yes.   Physician:  Polo RileyE Deterding  Mouhamed Glassco Hilliard Kingston Shawgo 04/22/2014 6:08 AM

## 2014-04-22 NOTE — Progress Notes (Signed)
Dr Roseanne RenoStewart notified of pt with intermittent inward flexing of LUE for approx 10min with spontaneous eye opening accompanied with Sinus tachycardia as high as 160 while trasnporting pt to CT. Orders given for 1000mg  of keppra stat and change BID dose to 1000mg  as well. Orders put in computer. Pt resting now after receiving 2mg  of versed IVP once back on unit from CT scan. Will continue to monitor.

## 2014-04-22 NOTE — Progress Notes (Signed)
Stroke Team Progress Note  HISTORY Courtney Greene is an 53 y.o. female with a history of hypertension who was brought to the emergency room and code stroke status following onset of right-sided weakness followed by increasing obtundation and onset of generalized seizure activity. She was last known well at 1:30 PM today. On arrival in the emergency room she was unresponsive and exhibiting generalized seizure activity as well as nausea and vomiting. Patient was intubated for airway protection and placed on mechanical ventilation. She was also given 2 mg of Ativan intravenously with no recurrence of seizure activity. CT scan of her head showed a large left thalamic hemorrhage with extension into lateral ventricles, with a large amount of blood as well and third and fourth ventricles. Blood pressure was markedly elevated. She reportedly had blood pressure of 300/150 in route to the emergency room. Blood pressure recorded in the emergency room was maximum at 227/150. NIH stroke score was 28. Patient had no history of stroke or TIA, no history of seizure activity.  Patient was not administered TPA secondary to ICH. She was admitted to the neuro ICU for further evaluation and treatment.  SUBJECTIVE Her son is at the bedside, he states she is resting comfortably. Per RN, no overnight events, BP well controlled with cardene gtt. Repeat head CT stable. Case discussed with neurosurgery, decision pending regards to benefit of ventriculostomy. Remains intubated.   OBJECTIVE Most recent Vital Signs: Filed Vitals:   04/22/14 0600 04/22/14 0700 04/22/14 0715 04/22/14 0730  BP: 142/67 167/71 171/87 162/71  Pulse: 115 131 132 115  Temp:      TempSrc:      Resp: 23 28 32 27  Height:      Weight:      SpO2: 100% 100% 100% 100%   CBG (last 3)   Recent Labs  05/07/14 2331 04/22/14 0417 04/22/14 0809  GLUCAP 154* 146* 144*    IV Fluid Intake:   . sodium chloride 75 mL/hr at 04/22/14 0806  . niCARDipine 10  mg/hr (04/22/14 0723)  . propofol Stopped (04/22/14 0720)    MEDICATIONS  . antiseptic oral rinse  15 mL Mouth Rinse QID  . chlorhexidine  15 mL Mouth Rinse BID  . etomidate  20 mg Intravenous Once  . famotidine (PEPCID) IV  20 mg Intravenous Q12H  . insulin aspart  0-9 Units Subcutaneous 6 times per day  . levETIRAcetam  500 mg Intravenous Q12H  . pantoprazole (PROTONIX) IV  40 mg Intravenous QHS  . propofol  50 mg Intravenous Once  . propofol  5-70 mcg/kg/min Intravenous Once  . succinylcholine  100 mg Intravenous Once   PRN:  acetaminophen, acetaminophen, fentaNYL, hydrALAZINE, labetalol, labetalol, metoprolol  Diet:  NPO  Activity:  Bedrest DVT Prophylaxis:  SCDs  CLINICALLY SIGNIFICANT STUDIES Basic Metabolic Panel:  Recent Labs Lab May 07, 2014 1407 05/07/2014 1432 04/22/14 0245  NA 141 142 139  K 2.9* 4.2 3.5*  CL 101 107 105  CO2 17*  --  20  GLUCOSE 165* 200* 149*  BUN 20 28* 15  CREATININE 0.78 1.10 0.71  CALCIUM 10.6*  --  9.3   Liver Function Tests:  Recent Labs Lab 05-07-2014 1407  AST 29  ALT 16  ALKPHOS 106  BILITOT 0.3  PROT 8.8*  ALBUMIN 3.5   CBC:  Recent Labs Lab May 07, 2014 1407 05/07/14 1432 04/22/14 0245  WBC 14.1*  --  20.3*  NEUTROABS 5.4  --   --   HGB 13.4 15.6* 11.2*  HCT 41.3 46.0 35.1*  MCV 75.2*  --  75.2*  PLT 345  --  248   Coagulation:  Recent Labs Lab 05/05/2014 1407  LABPROT 12.7  INR 0.97   Cardiac Enzymes: No results found for this basename: CKTOTAL, CKMB, CKMBINDEX, TROPONINI,  in the last 168 hours Urinalysis:  Recent Labs Lab 05/20/2014 1634  COLORURINE YELLOW  LABSPEC 1.031*  PHURINE 6.0  GLUCOSEU NEGATIVE  HGBUR MODERATE*  BILIRUBINUR NEGATIVE  KETONESUR NEGATIVE  PROTEINUR >300*  UROBILINOGEN 0.2  NITRITE NEGATIVE  LEUKOCYTESUR NEGATIVE   Lipid Panel No results found for this basename: chol, trig, hdl, cholhdl, vldl, ldlcalc   HgbA1C  No results found for this basename: HGBA1C    Urine Drug Screen:    No results found for this basename: labopia, cocainscrnur, labbenz, amphetmu, thcu, labbarb    Alcohol Level: No results found for this basename: ETH,  in the last 168 hours   CT of the brain    04/22/2014   CT head w/o contrast  IMPRESSION: 1. Large left periventricular/ left thalamic hemorrhage with intraventricular extension. Overall volume of intraventricular hemorrhage is slightly decreased relative to 04/24/2014, compatible with redistribution. Stable ventricular dilatation. 2. Left-to-right midline shift of approximately 7 mm, not significantly changed.   Electronically Signed   By: Rise MuBenjamin  McClintock M.D.   On: 04/22/2014 07:10   04/21/2014 Ct Head (brain) Wo Contrast  IMPRESSION: 1. Large left periventricular, left thalamic hemorrhage with rupture into the ventricles. A large amount of blood is noted in the third, lateral, and fourth ventricles. Midline shift from left-to-right of approximately 8 mm noted. Diffuse cerebral edema present. 2. Opacification of the right frontal, right maxillary, and sphenoid sinuses consistent with sinusitis. These results were called by telephone at the time of interpretation on 05/12/2014 at 2:51 PM to Dr. Susy FrizzleHARLES SHELDON , who verbally acknowledged these results.   Electronically Signed   By: Maisie Fushomas  Register   On: 05/11/2014 14:53    MRI of the brain    MRA of the brain    2D Echocardiogram    Carotid Doppler    CXR     Therapy Recommendations   Physical Exam   Patient was intubated and on mechanical ventilation. Propofol recently turned off. No response to verbal stimuli, minimal response to noxious stimuli. Not following commands Pupils were 1-572mm and unresponsive, midline, no gaze deviation No clear facial weakness was noted. Gag reflex is present Muscle tone was flaccid throughout. With noxious stimuli noted minimal delayed flexion of RUE and brief extension of LUE. Question triple flex with noxious stimuli of bilateral  LE.    ASSESSMENT Courtney Greene is a 53 y.o. female presenting with right sided weakness. No TPA given secondary to ICH. Imaging confirms a large left periventricular/left thalamic ICH with intraventricular extension and 7mm midline shift.Repeat head CT is stable. ICH etiology thought to be hypertensive. Stroke work up underway.   HTN  LDL and Hemoglobin A1c pending  Seizures   Hospital day # 1  TREATMENT/PLAN  MRI/A brain  Repeat head CT this evening or earlier if change in clinical status  Consider ventriculostomy per neurosurgery recs  Continue keppra 500mg  BID  Goal SBP 140-170, cardene gtt as needed with prn hydralazine and labetalol  Vent management per CCM   This patient is critically ill and at significant risk of neurological worsening, death and care requires constant monitoring of vital signs, hemodynamics,respiratory and cardiac monitoring, neurological assessment, discussion with family, other specialists and medical decision  making of high complexity. I spent 45 minutes of neurocritical care time in the care of this patient.  Elspeth ChoPeter Xochil Shanker, DO Neurology-Stroke  To contact Stroke Continuity provider, please refer to [email or web address]. After hours, contact General Neurology

## 2014-04-22 NOTE — Progress Notes (Signed)
Transported patient to C.T while on the ventilator. Patient remained hemodynamically stable during transport.

## 2014-04-23 DIAGNOSIS — I059 Rheumatic mitral valve disease, unspecified: Secondary | ICD-10-CM

## 2014-04-23 DIAGNOSIS — G911 Obstructive hydrocephalus: Secondary | ICD-10-CM

## 2014-04-23 DIAGNOSIS — G936 Cerebral edema: Secondary | ICD-10-CM

## 2014-04-23 DIAGNOSIS — R569 Unspecified convulsions: Secondary | ICD-10-CM

## 2014-04-23 DIAGNOSIS — Z66 Do not resuscitate: Secondary | ICD-10-CM | POA: Diagnosis not present

## 2014-04-23 LAB — GLUCOSE, CAPILLARY
GLUCOSE-CAPILLARY: 133 mg/dL — AB (ref 70–99)
GLUCOSE-CAPILLARY: 134 mg/dL — AB (ref 70–99)
GLUCOSE-CAPILLARY: 140 mg/dL — AB (ref 70–99)
GLUCOSE-CAPILLARY: 145 mg/dL — AB (ref 70–99)
Glucose-Capillary: 125 mg/dL — ABNORMAL HIGH (ref 70–99)
Glucose-Capillary: 116 mg/dL — ABNORMAL HIGH (ref 70–99)

## 2014-04-23 MED ORDER — POTASSIUM CHLORIDE 20 MEQ/15ML (10%) PO LIQD
40.0000 meq | Freq: Three times a day (TID) | ORAL | Status: AC
  Administered 2014-04-23 (×2): 40 meq
  Filled 2014-04-23 (×2): qty 30

## 2014-04-23 MED ORDER — VITAL HIGH PROTEIN PO LIQD
1000.0000 mL | ORAL | Status: DC
  Administered 2014-04-23: 1000 mL
  Administered 2014-04-23: 19:00:00
  Administered 2014-04-24 – 2014-04-25 (×2): 1000 mL
  Filled 2014-04-23 (×4): qty 1000

## 2014-04-23 NOTE — Progress Notes (Signed)
PULMONARY / CRITICAL CARE MEDICINE   Name: Courtney CorwinJoanne Greene MRN: [id number] DOB: 02/06/1961    ADMISSION DATE:  05/01/2014 CONSULTATION DATE:  04/22/2014   REFERRING MD :  Neurology  PRIMARY SERVICE: Neurology   BRIEF PATIENT DESCRIPTION: 53 y/o F admitted to Stroke Service. with thalamic hemorrhagic CVA with IVH, hydrocephalus and cerebral edema.    SIGNIFICANT EVENTS: 5/2 - admit with large ICH  STUDIES:  5/02 - CT HEAD: large left periventricular, L thalamic hemorrhage with rupture into the ventricles.  Blood noted in third, lateral & 4th ventricles. 8 mm midline shift.  R maxillary, frontal & sphenoid sinusitis 5/02 - ECHO: 5/02 - Carotid Doppler: 5/03 CT head: Overall volume of intraventricular hemorrhage is slightly decreased relative to 04/30/2014, compatible with redistribution. Stable ventricular dilatation 5/03 CT head: NSC  LINES / TUBES: ETT 5/2 >> PIV  CULTURES: None  ANTIBIOTICS: None  SUBJECTIVE:  Comatose  VITAL SIGNS: Temp:  [98.1 F (36.7 C)-98.8 F (37.1 C)] 98.1 F (36.7 C) (05/04 0745) Pulse Rate:  [94-156] 112 (05/04 0745) Resp:  [20-48] 26 (05/04 0745) BP: (113-194)/(64-134) 148/75 mmHg (05/04 0745) SpO2:  [91 %-100 %] 98 % (05/04 0745) FiO2 (%):  [30 %-40 %] 40 % (05/04 0715)  HEMODYNAMICS:    VENTILATOR SETTINGS: Vent Mode:  [-] PRVC FiO2 (%):  [30 %-40 %] 40 % Set Rate:  [20 bmp] 20 bmp Vt Set:  [450 mL] 450 mL PEEP:  [5 cmH20] 5 cmH20 Plateau Pressure:  [16 cmH20-24 cmH20] 19 cmH20  INTAKE / OUTPUT: Intake/Output     05/03 0701 - 05/04 0700 05/04 0701 - 05/05 0700   I.V. (mL/kg) 2425.4 (31.1)    NG/GT 479.3    IV Piggyback 425    Total Intake(mL/kg) 3329.8 (42.7)    Urine (mL/kg/hr) 925 (0.5)    Total Output 925     Net +2404.8           PHYSICAL EXAMINATION: General: Unresponsive Neuro: Comatose, ? posturing HEENT: WNL Cardiovascular: RRR s M Lungs: clear Abdomen: soft, +BS Ext:  Warm, no edema   LABS: I have reviewed  all of today's lab results. 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 - ?if family wishes full support neuro is considering 3% saline.  PULMONARY A: Acute respiratory failure  P:   - Cont full vent support - settings reviewed and/or adjusted. - Cont vent bundle. - Daily SBT if/when meets criteria, no extubation given neuro status.  CARDIOVASCULAR A:  Hypertensive Emergency  - controlled Tachycardia - resolved P:  - Cont nicardipine gtt. - Cont PRN metoprolol, labetalol and hydralazine.  RENAL A:   Mild hyperkalemia P:   - Monitor BMET intermittently. - Monitor I/Os. - Correct electrolytes as indicated.  GASTROINTESTINAL A:   No acute issues P:   - SUP: Famotidine. - Continue TF.  HEMATOLOGIC A:   No issues P:  - DVT px: SCDs, d/c heparin. - Monitor CBC intermittently. - Transfuse per usual ICU guidelines.  INFECTIOUS A:   No evidence of acute infectious process P:   - Monitor fever curve / leukocytosis.  ENDOCRINE A:   Hyperglycemia without prior dx of DM P:   - Cont SSI.  Will converse with Dr. Pearlean BrownieSethi today then will go speak with the family regarding plan of care.  If they wish for full support then will place TLC for 3% and will likely proceed with early tracheostomy.  I have personally obtained a history, examined the patient, evaluated laboratory and imaging results, formulated the assessment and plan and placed orders.  CRITICAL CARE: The patient is critically ill with multiple organ systems failure and requires high complexity decision making for assessment and support, frequent evaluation and titration of therapies, application of advanced monitoring technologies and extensive interpretation of multiple databases. Critical Care Time devoted to patient  care services described in this note is 35 minutes.   Alyson ReedyWesam G. Yacoub, M.D. [facility]. Pager: [phone]. After hours pager: [phone].

## 2014-04-23 NOTE — Progress Notes (Signed)
Patient ID: Courtney CorwinJoanne Greene, female   DOB: [birth year], 53 y.o.   MRN: [id number] Events noted. I am available for any questions. Will continue to follow.  I did discuss her situation with several of my partners because I found the decision making in this case very difficult and complex. We all agreed that ventriculostomy placement likely would not change the ultimate outcome because we feel that her main problem is the large intracerebral hemorrhage and not necessarily the ventricular extension and mild ventriculomegaly related to that. It potentially could reduce intracranial pressure which can be beneficial but we all felt the drain would only last for a short time before clotting off and therefore would be beneficial for only a very short time. There also risk with placing the drain including right frontal intracerebral hemorrhage and ventriculitis/infection. Therefore the ultimate decision was to not proceed with ventriculostomy drain placement at this time.

## 2014-04-23 NOTE — Progress Notes (Signed)
INITIAL NUTRITION ASSESSMENT  DOCUMENTATION CODES Per approved criteria  -Not Applicable   INTERVENTION: Increase by 10 ml every 4 hours to goal rate of 60 ml/hr.   At goal rate, tube feeding regimen will provide 1480 kcal, 126 grams of protein, and 1203 ml of H2O.   TF regimen and current Propofol provides 1706 total kcal (98% of needs)   NUTRITION DIAGNOSIS: Inadequate oral intake related to inability to eat as evidenced by NPO status  Goal: Pt to meet >/= 90% of their estimated nutrition needs   Monitor:  Vent status, TF tolerance, weight trend, plan of care  Reason for Assessment: Consult received to initiate and manage enteral nutrition support.  53 y.o. female  Admitting Dx: <principal problem not specified>  ASSESSMENT: Pt admitted with large L thalamic hemorrhagic CVA with IVH, hydrocephalus and cerebral edema. Family has made pt a DNR, plans for withdrawal in discussion.  Patient is currently intubated on ventilator support MV: 14.6 L/min Temp (24hrs), Avg:98.5 F (36.9 C), Min:98.1 F (36.7 C), Max:98.8 F (37.1 C)  Propofol: 10.1 ml/hr providing 266 kcal per day from lipid   Height: Ht Readings from Last 1 Encounters:  05/18/2014 5\' 5"  (1.651 m)    Weight: Wt Readings from Last 1 Encounters:  04/29/2014 171 lb 15.3 oz (78 kg)    Ideal Body Weight: 56.8 kg   % Ideal Body Weight: 137%  Wt Readings from Last 10 Encounters:  04/24/2014 171 lb 15.3 oz (78 kg)    Usual Body Weight: unknown  % Usual Body Weight: -  BMI:  Body mass index is 28.62 kg/(m^2).  Estimated Nutritional Needs: Kcal: 1746 Protein: 95-120 grams Fluid: > 1.7 L/day  Skin: intact  Diet Order:    EDUCATION NEEDS: -No education needs identified at this time   Intake/Output Summary (Last 24 hours) at 04/23/14 1106 Last data filed at 04/23/14 0900  Gross per 24 hour  Intake 3114.99 ml  Output    750 ml  Net 2364.99 ml    Last BM: PTA   Labs:   Recent Labs Lab  05/09/2014 1407 05/08/2014 1432 04/22/14 0245  NA 141 142 139  K 2.9* 4.2 3.5*  CL 101 107 105  CO2 17*  --  20  BUN 20 28* 15  CREATININE 0.78 1.10 0.71  CALCIUM 10.6*  --  9.3  GLUCOSE 165* 200* 149*    CBG (last 3)   Recent Labs  04/22/14 2345 04/23/14 0415 04/23/14 0752  GLUCAP 145* 140* 134*    Scheduled Meds: . antiseptic oral rinse  15 mL Mouth Rinse QID  . chlorhexidine  15 mL Mouth Rinse BID  . famotidine (PEPCID) IV  20 mg Intravenous Q12H  . feeding supplement (VITAL HIGH PROTEIN)  1,000 mL Per Tube Q24H  . insulin aspart  0-9 Units Subcutaneous 6 times per day  . levETIRAcetam  1,000 mg Intravenous Q12H  . potassium chloride  40 mEq Per Tube TID    Continuous Infusions: . sodium chloride 75 mL/hr at 04/23/14 0218  . niCARDipine 10 mg/hr (04/23/14 0823)  . propofol 35 mcg/kg/min (04/23/14 1029)    Past Medical History  Diagnosis Date  . HTN (hypertension)     History reviewed. No pertinent past surgical history.  Kendell BaneHeather Phelicia Dantes RD, LDN, CNSC [phone] Pager [phone] After Hours Pager

## 2014-04-23 NOTE — Progress Notes (Signed)
Chaplain responded to referral from nurse concerning family of pt. Pt's son and daughter were in chapel talking through funeral arrangements. Chaplain offered a prayer and talked them through arrangements. Chaplain followed up later and offered another prayer when family's friend arrived.

## 2014-04-23 NOTE — Progress Notes (Signed)
Spoke with son and daughter extensively.  After discussion, decision was made to make patient DNR and family is discussing when to withdraw.  Additional CC time of 45 min.  Alyson ReedyWesam G. Yacoub, M.D. [facility]Critical [facility] Medicine. Pager: [phone]. After hours pager: [phone].

## 2014-04-23 NOTE — Progress Notes (Signed)
Stroke Team Progress Note  HISTORY Courtney Greene is an 53 y.o. female with a history of hypertension who was brought to the emergency room in code stroke status following onset of right-sided weakness followed by increasing obtundation and onset of generalized seizure activity. She was last known well at 1:30 PM today 05/12/2014. On arrival in the emergency room she was unresponsive and exhibiting generalized seizure activity as well as nausea and vomiting. Patient was intubated for airway protection and placed on mechanical ventilation. She was also given 2 mg of Ativan intravenously with no recurrence of seizure activity. CT scan of her head showed a large left thalamic hemorrhage with extension into lateral ventricles, with a large amount of blood as well and third and fourth ventricles. Blood pressure was markedly elevated. She reportedly had blood pressure of 300/150 in route to the emergency room. Blood pressure recorded in the emergency room was maximum at 227/150. NIH stroke score was 28. Patient had no history of stroke or TIA, no history of seizure activity.Patient was not administered TPA secondary to ICH. She was admitted to the neuro ICU for further evaluation and treatment.  SUBJECTIVE Family present. Dr. Pearlean BrownieSethi spoke with them in the conference room - Dr. Pearlean BrownieSethi discussed diagnosis, prognosis,  treatment options and plan of care.  OBJECTIVE Most recent Vital Signs: Filed Vitals:   04/23/14 0515 04/23/14 0530 04/23/14 0545 04/23/14 0714  BP: 153/74 151/90 147/77 165/84  Pulse: 112 112 113 114  Temp:      TempSrc:      Resp: 28 29 31 30   Height:      Weight:      SpO2: 98% 97% 96% 96%   CBG (last 3)   Recent Labs  04/22/14 1951 04/22/14 2345 04/23/14 0415  GLUCAP 139* 145* 140*    IV Fluid Intake:   . sodium chloride 75 mL/hr at 04/23/14 0218  . niCARDipine 3 mg/hr (04/23/14 0537)  . propofol Stopped (04/23/14 0746)    MEDICATIONS  . antiseptic oral rinse  15 mL Mouth  Rinse QID  . chlorhexidine  15 mL Mouth Rinse BID  . famotidine (PEPCID) IV  20 mg Intravenous Q12H  . feeding supplement (VITAL HIGH PROTEIN)  1,000 mL Per Tube Q24H  . insulin aspart  0-9 Units Subcutaneous 6 times per day  . levETIRAcetam  1,000 mg Intravenous Q12H   PRN:  acetaminophen, acetaminophen, fentaNYL, hydrALAZINE, labetalol, metoprolol, midazolam  Diet:     Activity:  Bedrest DVT Prophylaxis:  SCDs  CLINICALLY SIGNIFICANT STUDIES Basic Metabolic Panel:   Recent Labs Lab 05/14/2014 1407 05/16/2014 1432 04/22/14 0245  NA 141 142 139  K 2.9* 4.2 3.5*  CL 101 107 105  CO2 17*  --  20  GLUCOSE 165* 200* 149*  BUN 20 28* 15  CREATININE 0.78 1.10 0.71  CALCIUM 10.6*  --  9.3   Liver Function Tests:   Recent Labs Lab 04/20/2014 1407  AST 29  ALT 16  ALKPHOS 106  BILITOT 0.3  PROT 8.8*  ALBUMIN 3.5   CBC:   Recent Labs Lab 05/19/2014 1407 05/08/2014 1432 04/22/14 0245  WBC 14.1*  --  20.3*  NEUTROABS 5.4  --   --   HGB 13.4 15.6* 11.2*  HCT 41.3 46.0 35.1*  MCV 75.2*  --  75.2*  PLT 345  --  248   Coagulation:   Recent Labs Lab 05/13/2014 1407  LABPROT 12.7  INR 0.97   Cardiac Enzymes: No results found for this  basename: CKTOTAL, CKMB, CKMBINDEX, TROPONINI,  in the last 168 hours Urinalysis:   Recent Labs Lab 05/14/2014 1634  COLORURINE YELLOW  LABSPEC 1.031*  PHURINE 6.0  GLUCOSEU NEGATIVE  HGBUR MODERATE*  BILIRUBINUR NEGATIVE  KETONESUR NEGATIVE  PROTEINUR >300*  UROBILINOGEN 0.2  NITRITE NEGATIVE  LEUKOCYTESUR NEGATIVE   Lipid Panel No results found for this basename: chol,  trig,  hdl,  cholhdl,  vldl,  ldlcalc   HgbA1C  No results found for this basename: HGBA1C    Urine Drug Screen:   No results found for this basename: labopia,  cocainscrnur,  labbenz,  amphetmu,  thcu,  labbarb    Alcohol Level: No results found for this basename: ETH,  in the last 168 hours  Abd Portable 04/26/2014   Orogastric tube noted with its tip  projected over the distal stomach. No gastric distention .      CT of the brain   04/22/2014   No significant change in the left periventricularthalamic hemorrhage with intraventricular extension, 6 mm left-to-right midline shift and hydrocephalus.    04/22/2014   1. Large left periventricular/ left thalamic hemorrhage with intraventricular extension. Overall volume of intraventricular hemorrhage is slightly decreased relative to 04/20/2014, compatible with redistribution. Stable ventricular dilatation. 2. Left-to-right midline shift of approximately 7 mm, not significantly changed.    05/16/2014   1. Large left periventricular, left thalamic hemorrhage with rupture into the ventricles. A large amount of blood is noted in the third, lateral, and fourth ventricles. Midline shift from left-to-right of approximately 8 mm noted. Diffuse cerebral edema present. 2. Opacification of the right frontal, right maxillary, and sphenoid sinuses consistent with sinusitis.   MRI of the brain    MRA of the brain    2D Echocardiogram  EF 55-60% with no source of embolus.   Carotid Doppler    CXR   04/22/2014    1. Satisfactory position of ET tube with tip above the carina    04/24/2014    No acute cardiopulmonary disease.  ET tube well positioned.  Therapy Recommendations   Physical Exam   Patient is intubated and on mechanical ventilation. Propofol recently turned off. . Afebrile. Head is nontraumatic. Neck is supple without bruit.  . Cardiac exam no murmur or gallop. Lungs are clear to auscultation. Distal pulses are well felt.  Neurological Exam : Comatose and unresponsive. Eyes closed. No response to verbal stimuli, minimal response to noxious stimuli. Not following commands Pupils were 1-542mm and unresponsive, midline, skew gaze deviation No clear facial weakness was noted. Gag reflex is present Muscle tone was flaccid throughout. With noxious stimuli noted minimal delayed flexion of RUE and brief extension of  LUE and occasional decerebrate posturing. Question triple flex with noxious stimuli of bilateral LE. DTRs depressed. Plantars both downgoing.    ASSESSMENT Courtney Greene is a 53 y.o. female presenting with right sided weakness. Imaging confirms a large left thalamic ICH with intraventricular extension and 7mm midline shift due to cytotoxic cerebral edema and hydrocephalus. Repeat head CT is stable. ICH secondary to malignant hypertension. Neurosurgeon Dr. Yetta BarreJones is following; surgical intervention/IVC placement not thought to add benefit at this time. On no antithrombotics prior to admission. Overall prognosis is poor. Dr. Pearlean BrownieSethi addressed comfort care with family.   Acute respiratory failure  Placed on the ventilator in the ED  Sedated with propofol  CCM managing  Malignant HTN, BP 300/150, placed on cardene  Seizure, most likely secondary to hemorrhage  Placed on keppra, increased to  1000mg  BID  Dysphagia secondary to hemorrhage  Tube feedings  Hospital day # 2  TREATMENT/PLAN  SBP goal < 180, cardene gtt as needed with prn hydralazine and labetalol  F/u CD  Family considering comfort care, will let us know.   Long discussion with daughter, son, son-in-law and Dr. Molli Knock about patient's poor prognosis, goals of care and answered questions   Annie Main, MSN, RN, ANVP-BC, ANP-BC, GNP-BC [facility] Pager: [phone] 04/23/2014 7:54 AM This patient is critically ill and at significant risk of neurological worsening, death and care requires constant monitoring of vital signs, hemodynamics,respiratory and cardiac monitoring,review of multiple databases, neurological assessment, discussion with family, other specialists and medical decision making of high complexity. I spent 50 minutes of neurocritical care time  in the care of  this patient. I have personally obtained a history, examined the patient, evaluated imaging results, and formulated the assessment and  plan of care. I agree with the above. Delia Heady, MD  To contact Stroke Continuity provider, please refer to [email or web address]. After hours, contact General Neurology

## 2014-04-23 NOTE — Progress Notes (Signed)
*  PRELIMINARY RESULTS* Vascular Ultrasound Carotid Duplex (Doppler) has been completed.  Preliminary findings: Bilateral:  1-39% ICA stenosis.  Vertebral artery flow is antegrade.      Farrel DemarkJill Eunice, RDMS, RVT  04/23/2014, 9:17 AM

## 2014-04-23 NOTE — Progress Notes (Signed)
Echocardiogram 2D Echocardiogram has been performed.  Courtney Greene 04/23/2014, 9:51 AM

## 2014-04-24 ENCOUNTER — Inpatient Hospital Stay (HOSPITAL_COMMUNITY): Payer: Medicaid Other

## 2014-04-24 DIAGNOSIS — G911 Obstructive hydrocephalus: Secondary | ICD-10-CM

## 2014-04-24 DIAGNOSIS — G936 Cerebral edema: Secondary | ICD-10-CM

## 2014-04-24 LAB — BASIC METABOLIC PANEL
BUN: 15 mg/dL (ref 6–23)
CO2: 17 mEq/L — ABNORMAL LOW (ref 19–32)
Calcium: 9.2 mg/dL (ref 8.4–10.5)
Chloride: 114 mEq/L — ABNORMAL HIGH (ref 96–112)
Creatinine, Ser: 0.63 mg/dL (ref 0.50–1.10)
GFR calc Af Amer: >90 mL/min (ref >90)
GLUCOSE: 161 mg/dL — AB (ref 70–99)
POTASSIUM: 3.8 meq/L (ref 3.7–5.3)
Sodium: 148 mEq/L — ABNORMAL HIGH (ref 137–147)

## 2014-04-24 LAB — URINALYSIS, ROUTINE W REFLEX MICROSCOPIC
Bilirubin Urine: NEGATIVE
Glucose, UA: NEGATIVE mg/dL
Ketones, ur: NEGATIVE mg/dL
NITRITE: POSITIVE — AB
PROTEIN: 30 mg/dL — AB
SPECIFIC GRAVITY, URINE: 1.019 (ref 1.005–1.030)
Urobilinogen, UA: 1 mg/dL (ref 0.0–1.0)
pH: 5 (ref 5.0–8.0)

## 2014-04-24 LAB — CBC
HEMATOCRIT: 35.4 % — AB (ref 36.0–46.0)
Hemoglobin: 11.1 g/dL — ABNORMAL LOW (ref 12.0–15.0)
MCH: 24.1 pg — ABNORMAL LOW (ref 26.0–34.0)
MCHC: 31.4 g/dL (ref 30.0–36.0)
MCV: 76.8 fL — AB (ref 78.0–100.0)
Platelets: 259 10*3/uL (ref 150–400)
RBC: 4.61 MIL/uL (ref 3.87–5.11)
RDW: 22.4 % — AB (ref 11.5–15.5)
WBC: 25.8 10*3/uL — ABNORMAL HIGH (ref 4.0–10.5)

## 2014-04-24 LAB — URINE MICROSCOPIC-ADD ON

## 2014-04-24 LAB — BLOOD GAS, ARTERIAL
ACID-BASE DEFICIT: 6.4 mmol/L — AB (ref 0.0–2.0)
Bicarbonate: 18.2 mEq/L — ABNORMAL LOW (ref 20.0–24.0)
Drawn by: 347621
FIO2: 100 %
O2 SAT: 94.9 %
PCO2 ART: 35.4 mmHg (ref 35.0–45.0)
PEEP: 5 cmH2O
Patient temperature: 100
RATE: 20 resp/min
TCO2: 19.2 mmol/L (ref 0–100)
VT: 450 mL
pH, Arterial: 7.335 — ABNORMAL LOW (ref 7.350–7.450)
pO2, Arterial: 88.6 mmHg (ref 80.0–100.0)

## 2014-04-24 LAB — GLUCOSE, CAPILLARY
GLUCOSE-CAPILLARY: 148 mg/dL — AB (ref 70–99)
GLUCOSE-CAPILLARY: 162 mg/dL — AB (ref 70–99)
GLUCOSE-CAPILLARY: 154 mg/dL — AB (ref 70–99)
Glucose-Capillary: 127 mg/dL — ABNORMAL HIGH (ref 70–99)
Glucose-Capillary: 134 mg/dL — ABNORMAL HIGH (ref 70–99)
Glucose-Capillary: 135 mg/dL — ABNORMAL HIGH (ref 70–99)
Glucose-Capillary: 137 mg/dL — ABNORMAL HIGH (ref 70–99)

## 2014-04-24 LAB — PATHOLOGIST SMEAR REVIEW: PATH REVIEW: REACTIVE

## 2014-04-24 LAB — MAGNESIUM: Magnesium: 1.9 mg/dL (ref 1.5–2.5)

## 2014-04-24 LAB — PHOSPHORUS: Phosphorus: 1.6 mg/dL — ABNORMAL LOW (ref 2.3–4.6)

## 2014-04-24 MED ORDER — ETOMIDATE 2 MG/ML IV SOLN
INTRAVENOUS | Status: AC
  Filled 2014-04-24: qty 10

## 2014-04-24 MED ORDER — VANCOMYCIN HCL IN DEXTROSE 1-5 GM/200ML-% IV SOLN
1000.0000 mg | Freq: Two times a day (BID) | INTRAVENOUS | Status: DC
Start: 2014-04-24 — End: 2014-04-25
  Administered 2014-04-24 – 2014-04-25 (×3): 1000 mg via INTRAVENOUS
  Filled 2014-04-24 (×4): qty 200

## 2014-04-24 MED ORDER — FREE WATER
250.0000 mL | Freq: Four times a day (QID) | Status: DC
  Administered 2014-04-24 – 2014-04-25 (×6): 250 mL

## 2014-04-24 MED ORDER — LABETALOL HCL 5 MG/ML IV SOLN
1.0000 mg/min | INTRAVENOUS | Status: DC
  Administered 2014-04-24: 1 mg/min via INTRAVENOUS
  Administered 2014-04-24: 2 mg/min via INTRAVENOUS
  Administered 2014-04-25: 1 mg/min via INTRAVENOUS
  Filled 2014-04-24 (×3): qty 100

## 2014-04-24 MED ORDER — PIPERACILLIN-TAZOBACTAM 3.375 G IVPB
3.3750 g | Freq: Three times a day (TID) | INTRAVENOUS | Status: DC
  Administered 2014-04-24 – 2014-04-25 (×4): 3.375 g via INTRAVENOUS
  Filled 2014-04-24 (×6): qty 50

## 2014-04-24 MED ORDER — VECURONIUM BROMIDE 10 MG IV SOLR
INTRAVENOUS | Status: AC
  Filled 2014-04-24: qty 10

## 2014-04-24 MED ORDER — VECURONIUM BROMIDE 10 MG IV SOLR
7.0000 mg | Freq: Once | INTRAVENOUS | Status: AC
  Administered 2014-04-24: 7 mg via INTRAVENOUS

## 2014-04-24 NOTE — Progress Notes (Signed)
PULMONARY / CRITICAL CARE MEDICINE   Name: Geralyn CorwinJoanne Seckinger MRN: [id number] DOB: [birth year]    ADMISSION DATE:  05/16/2014 CONSULTATION DATE:  05/15/2014   REFERRING MD :  Neurology  PRIMARY SERVICE: Neurology   BRIEF PATIENT DESCRIPTION: 53 y/o F admitted to Stroke Service. with thalamic hemorrhagic CVA with IVH, hydrocephalus and cerebral edema.    SIGNIFICANT EVENTS: 5/2 - admit with large ICH  STUDIES:  5/02 - CT HEAD: large left periventricular, L thalamic hemorrhage with rupture into the ventricles.  Blood noted in third, lateral & 4th ventricles. 8 mm midline shift.  R maxillary, frontal & sphenoid sinusitis 5/02 - ECHO: 5/02 - Carotid Doppler: 5/03 CT head: Overall volume of intraventricular hemorrhage is slightly decreased relative to 05/13/2014, compatible with redistribution. Stable ventricular dilatation 5/03 CT head: NSC  LINES / TUBES: ETT 5/2 >> PIV  CULTURES: Blood 5/5>>> Urine 5/5>>> Sputum 5/5>>>  ANTIBIOTICS: Vancomycin 5/5>>> Zosyn 5/5>>>  SUBJECTIVE:  Comatose, fever overnight.  VITAL SIGNS: Temp:  [98.3 F (36.8 C)-102.4 F (39.1 C)] 98.3 F (36.8 C) (05/05 0254) Pulse Rate:  [95-153] 112 (05/05 0615) Resp:  [26-52] 32 (05/05 0615) BP: (117-223)/(68-94) 160/84 mmHg (05/05 0615) SpO2:  [87 %-98 %] 97 % (05/05 0615) FiO2 (%):  [40 %-100 %] 100 % (05/05 0600)  HEMODYNAMICS:    VENTILATOR SETTINGS: Vent Mode:  [-] PRVC FiO2 (%):  [40 %-100 %] 100 % Set Rate:  [20 bmp] 20 bmp Vt Set:  [450 mL] 450 mL PEEP:  [5 cmH20] 5 cmH20 Plateau Pressure:  [20 cmH20-24 cmH20] 24 cmH20  INTAKE / OUTPUT: Intake/Output     05/04 0701 - 05/05 0700 05/05 0701 - 05/06 0700   I.V. (mL/kg) 3651.8 (46.8)    NG/GT 1140    IV Piggyback 270    Total Intake(mL/kg) 5061.8 (64.9)    Urine (mL/kg/hr) 1750 (0.9)    Total Output 1750     Net +3311.8           PHYSICAL EXAMINATION: General: Unresponsive Neuro: Comatose, ? posturing HEENT: WNL Cardiovascular: RRR s  M Lungs: clear Abdomen: soft, +BS Ext:  Warm, no edema   LABS: I have reviewed all of today's lab results. 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 - Spoke with family, made LCB with no CPR and no cardioversion, they do not wish for trach/peg and discussing when to terminally extubate.  PULMONARY A: Acute respiratory failure  P:   - Cont full vent support - settings reviewed and/or adjusted. - Cont vent bundle. - Change ETT today given leak on nightly event. - Likely bilateral pneumonias now, likely aspiration, see ID section.  CARDIOVASCULAR A:  Hypertensive Emergency  - controlled Tachycardia - resolved P:  - Cont nicardipine gtt. - Cont PRN metoprolol, labetalol and hydralazine.  RENAL A:   Mild hypernatremia P:   - Monitor BMET intermittently. - Monitor I/Os. - Correct electrolytes as indicated. - Free water for hypernatremia.  GASTROINTESTINAL A:   No acute issues P:   - SUP: Famotidine. - Continue TF.  HEMATOLOGIC A:   No issues P:  - DVT px: SCDs, d/c heparin. - Monitor CBC intermittently. - Transfuse per usual ICU guidelines.  INFECTIOUS A:   ?bilateral PNA at this point. P:   - Monitor fever curve / leukocytosis. -  Pan culture. - Vanc/zosyn.  ENDOCRINE A:   Hyperglycemia without prior dx of DM P:   - Cont SSI.  Spoke with family, now LCB with no CPR/cardioversion and family to decide on time for withdrawal.  Will change ETT today given leak and desat, increase PEEP.  Upon family arrival will question when are they ready for withdrawal.  I have personally obtained a history, examined the patient, evaluated laboratory and imaging results, formulated the assessment and plan and placed orders.  CRITICAL CARE: The patient is critically ill with  multiple organ systems failure and requires high complexity decision making for assessment and support, frequent evaluation and titration of therapies, application of advanced monitoring technologies and extensive interpretation of multiple databases. Critical Care Time devoted to patient care services described in this note is 35 minutes.   Alyson ReedyWesam G. Promiss Labarbera, M.D. [facility]. Pager: [phone]. After hours pager: [phone].

## 2014-04-24 NOTE — Progress Notes (Signed)
RT note: Patients ET tube was exchanged for a new one by NP, due to patient having a cuff leak. Patient tolerated procedure well. Rt will continue to monitor.

## 2014-04-24 NOTE — Progress Notes (Signed)
Stroke Team Progress Note  HISTORY Courtney Greene is an 53 y.o. female with a history of hypertension who was brought to the emergency room in code stroke status following onset of right-sided weakness followed by increasing obtundation and onset of generalized seizure activity. She was last known well at 1:30 PM today 05/03/2014. On arrival in the emergency room she was unresponsive and exhibiting generalized seizure activity as well as nausea and vomiting. Patient was intubated for airway protection and placed on mechanical ventilation. She was also given 2 mg of Ativan intravenously with no recurrence of seizure activity. CT scan of her head showed a large left thalamic hemorrhage with extension into lateral ventricles, with a large amount of blood as well and third and fourth ventricles. Blood pressure was markedly elevated. She reportedly had blood pressure of 300/150 in route to the emergency room. Blood pressure recorded in the emergency room was maximum at 227/150. NIH stroke score was 28. Patient had no history of stroke or TIA, no history of seizure activity.Patient was not administered TPA secondary to ICH. She was admitted to the neuro ICU for further evaluation and treatment.  SUBJECTIVE No family present this am. Trach tube replaced this am.  OBJECTIVE Most recent Vital Signs: Filed Vitals:   04/24/14 0545 04/24/14 0600 04/24/14 0615 04/24/14 0830  BP: 148/81 152/88 160/84 170/89  Pulse: 113 116 112 122  Temp:      TempSrc:      Resp: 27 28 32 32  Height:      Weight:      SpO2: 97% 96% 97% 95%   CBG (last 3)   Recent Labs  04/23/14 1604 04/23/14 1958 04/24/14 0803  GLUCAP 125* 116* 148*    IV Fluid Intake:   . niCARDipine 7.5 mg/hr (04/24/14 0758)  . propofol 60 mcg/kg/min (04/24/14 16100822)    MEDICATIONS  . antiseptic oral rinse  15 mL Mouth Rinse QID  . chlorhexidine  15 mL Mouth Rinse BID  . etomidate      . famotidine (PEPCID) IV  20 mg Intravenous Q12H  . feeding  supplement (VITAL HIGH PROTEIN)  1,000 mL Per Tube Q24H  . free water  250 mL Per Tube Q6H  . insulin aspart  0-9 Units Subcutaneous 6 times per day  . levETIRAcetam  1,000 mg Intravenous Q12H  . vecuronium      . vecuronium  7 mg Intravenous Once   PRN:  acetaminophen, acetaminophen, fentaNYL, hydrALAZINE, labetalol, metoprolol, midazolam  Diet:     Activity:  Bedrest DVT Prophylaxis:  SCDs  CLINICALLY SIGNIFICANT STUDIES Basic Metabolic Panel:   Recent Labs Lab 04/22/14 0245 04/24/14 0235  NA 139 148*  K 3.5* 3.8  CL 105 114*  CO2 20 17*  GLUCOSE 149* 161*  BUN 15 15  CREATININE 0.71 0.63  CALCIUM 9.3 9.2  MG  --  1.9  PHOS  --  1.6*   Liver Function Tests:   Recent Labs Lab 04/24/2014 1407  AST 29  ALT 16  ALKPHOS 106  BILITOT 0.3  PROT 8.8*  ALBUMIN 3.5   CBC:   Recent Labs Lab 05/13/2014 1407  04/22/14 0245 04/24/14 0235  WBC 14.1*  --  20.3* 25.8*  NEUTROABS 5.4  --   --   --   HGB 13.4  < > 11.2* 11.1*  HCT 41.3  < > 35.1* 35.4*  MCV 75.2*  --  75.2* 76.8*  PLT 345  --  248 259  < > =  values in this interval not displayed. Coagulation:   Recent Labs Lab Jul 17, 2014 1407  LABPROT 12.7  INR 0.97   Cardiac Enzymes: No results found for this basename: CKTOTAL, CKMB, CKMBINDEX, TROPONINI,  in the last 168 hours Urinalysis:   Recent Labs Lab Jul 17, 2014 1634  COLORURINE YELLOW  LABSPEC 1.031*  PHURINE 6.0  GLUCOSEU NEGATIVE  HGBUR MODERATE*  BILIRUBINUR NEGATIVE  KETONESUR NEGATIVE  PROTEINUR >300*  UROBILINOGEN 0.2  NITRITE NEGATIVE  LEUKOCYTESUR NEGATIVE   Lipid Panel No results found for this basename: chol,  trig,  hdl,  cholhdl,  vldl,  ldlcalc   HgbA1C  No results found for this basename: HGBA1C    Urine Drug Screen:   No results found for this basename: labopia,  cocainscrnur,  labbenz,  amphetmu,  thcu,  labbarb    Alcohol Level: No results found for this basename: ETH,  in the last 168 hours  Abd Portable 04/30/2014    Orogastric tube noted with its tip projected over the distal stomach. No gastric distention .      CT of the brain   04/22/2014   No significant change in the left periventricularthalamic hemorrhage with intraventricular extension, 6 mm left-to-right midline shift and hydrocephalus.    04/22/2014   1. Large left periventricular/ left thalamic hemorrhage with intraventricular extension. Overall volume of intraventricular hemorrhage is slightly decreased relative to 08-Sep-2014, compatible with redistribution. Stable ventricular dilatation. 2. Left-to-right midline shift of approximately 7 mm, not significantly changed.    04/29/2014   1. Large left periventricular, left thalamic hemorrhage with rupture into the ventricles. A large amount of blood is noted in the third, lateral, and fourth ventricles. Midline shift from left-to-right of approximately 8 mm noted. Diffuse cerebral edema present. 2. Opacification of the right frontal, right maxillary, and sphenoid sinuses consistent with sinusitis.   MRI of the brain    MRA of the brain    2D Echocardiogram  EF 55-60% with no source of embolus.   Carotid Doppler  No evidence of hemodynamically significant internal carotid artery stenosis. Vertebral artery flow is antegrade.   CXR   04/24/2014 Endotracheal tube 6 cm above the carina Progression of diffuse bilateral airspace disease, most suggestive of pulmonary edema however pneumonia could be present. Progressive volume loss in both lung bases.   04/22/2014    1. Satisfactory position of ET tube with tip above the carina    04/28/2014    No acute cardiopulmonary disease.  ET tube well positioned.  Therapy Recommendations   Physical Exam   Patient is intubated and on mechanical ventilation. Propofol recently turned off. . Afebrile. Head is nontraumatic. Neck is supple without bruit.  . Cardiac exam no murmur or gallop. Lungs are clear to auscultation. Distal pulses are well felt.  Neurological Exam : Comatose  and unresponsive. Eyes closed. No response to verbal stimuli, minimal response to noxious stimuli. Not following commands Pupils were 1-82mm and unresponsive, midline, skew gaze deviation No clear facial weakness was noted. Gag reflex is present Muscle tone was flaccid throughout. With noxious stimuli noted minimal delayed flexion of RUE and brief extension of LUE and occasional decerebrate posturing. Question triple flex with noxious stimuli of bilateral LE. DTRs depressed. Plantars both downgoing.    ASSESSMENT Ms. Courtney CorwinJoanne Ausley is a 53 y.o. female presenting with right sided weakness. Imaging confirms a large left thalamic ICH with intraventricular extension and 7mm midline shift due to cytotoxic cerebral edema and hydrocephalus. Repeat head CT is stable. ICH secondary to  malignant hypertension. Neurosurgeon Dr. Yetta Barre is following; surgical intervention/IVC placement not thought to add benefit; rediscussed yesterday with still no indication. On no antithrombotics prior to admission. Overall prognosis is poor. Dr. Pearlean Brownie addressed comfort care with family.   Acute respiratory failure  Placed on the ventilator in the ED  Sedated with propofol  CCM managing  Malignant HTN, BP 300/150, placed on cardene  Seizure, most likely secondary to hemorrhage  Placed on keppra, increased to 1000mg  BID  Dysphagia secondary to hemorrhage  Tube feedings  Hypernatremia  Leukocytosis, Cx pending. On [facility] day # 3  TREATMENT/PLAN  Continue aggressive care for now  Await family decision related to plan of care  D/w Dr Mauro Kaufmann, MSN, RN, ANVP-BC, ANP-BC, GNP-BC [facility] Pager: [phone] 04/24/2014 8:37 AM This patient is critically ill and at significant risk of neurological worsening, death and care requires constant monitoring of vital signs, hemodynamics,respiratory and cardiac monitoring,review of multiple databases, neurological assessment,  discussion with family, other specialists and medical decision making of high complexity. I spent 30 minutes of neurocritical care time  in the care of  this patient. I have personally obtained a history, examined the patient, evaluated imaging results, and formulated the assessment and plan of care. I agree with the above.  Delia Heady, MD  To contact Stroke Continuity provider, please refer to [email or web address]. After hours, contact General Neurology

## 2014-04-24 NOTE — Progress Notes (Signed)
ANTIBIOTIC CONSULT NOTE - INITIAL  Pharmacy Consult for vancomycin / zosyn Indication: pneumonia  No Known Allergies  Patient Measurements: Height: 5\' 5"  (165.1 cm) Weight: 171 lb 15.3 oz (78 kg) IBW/kg (Calculated) : 57   Vital Signs: Temp: 98.3 F (36.8 C) (05/05 0254) Temp src: Rectal (05/05 0254) BP: 170/89 mmHg (05/05 0830) Pulse Rate: 122 (05/05 0830) Intake/Output from previous day: 05/04 0701 - 05/05 0700 In: 5061.8 [I.V.:3651.8; NG/GT:1140; IV Piggyback:270] Out: 1750 [Urine:1750] Intake/Output from this shift:    Labs:  Recent Labs  05/01/2014 1407 05/04/2014 1432 04/22/14 0245 04/24/14 0235  WBC 14.1*  --  20.3* 25.8*  HGB 13.4 15.6* 11.2* 11.1*  PLT 345  --  248 259  CREATININE 0.78 1.10 0.71 0.63   Estimated Creatinine Clearance: 85.9 ml/min (by C-G formula based on Cr of 0.63). No results found for this basename: VANCOTROUGH, Leodis BinetVANCOPEAK, VANCORANDOM, GENTTROUGH, GENTPEAK, GENTRANDOM, TOBRATROUGH, TOBRAPEAK, TOBRARND, AMIKACINPEAK, AMIKACINTROU, AMIKACIN,  in the last 72 hours   Microbiology: Recent Results (from the past 720 hour(s))  MRSA PCR SCREENING     Status: None   Collection Time    05/05/2014  4:34 PM      Result Value Ref Range Status   MRSA by PCR NEGATIVE  NEGATIVE Final   Comment:            The GeneXpert MRSA Assay (FDA     approved for NASAL specimens     only), is one component of a     comprehensive MRSA colonization     surveillance program. It is not     intended to diagnose MRSA     infection nor to guide or     monitor treatment for     MRSA infections.    Medical History: Past Medical History  Diagnosis Date  . HTN (hypertension)     Assessment: 52yof admitted with ICH and unresposive, complicated by Sz and vomiting.  She was intubated to protect her airway.  She has possible aspiration pna.  Tm 102, WBC 25.8, Cxr RUL, RLL infiltrate, CrCrl 3580ml/min.    Goal of Therapy:  Vancomycin trough level 15-20 mcg/ml  Plan:   Zosyn 3.375GM q8hr EI Vancomycin 1gm q12hr  Leota SauersLisa Sheilia Reznick Pharm.D. CPP, BCPS Clinical Pharmacist [phone] 04/24/2014 9:45 AM

## 2014-04-24 NOTE — Procedures (Signed)
Intubation Procedure Note Courtney CorwinJoanne Greene [id number] [birth year]  Procedure: Intubation Indications: Respiratory insufficiency  Procedure Details Consent: Unable to obtain consent because of emergent medical necessity. Time Out: Verified patient identification, verified procedure, site/side was marked, verified correct patient position, special equipment/implants available, medications/allergies/relevent history reviewed, required imaging and test results available.  Performed  Maximum sterile technique was used including antiseptics, gloves, hand hygiene and mask.  MAC    Evaluation Hemodynamic Status: BP stable throughout; O2 sats: stable throughout Patient's Current Condition: stable Complications: No apparent complications Patient did tolerate procedure well. Chest X-ray ordered to verify placement.  CXR: pending.   Courtney ReedyWesam G [name] 04/24/2014

## 2014-04-24 NOTE — Progress Notes (Signed)
RT note: ET Tube was advanced per MD order to 23. Equal chest rise and bilateral breath sounds are noted. Patient tolerated procedure well. Spo2-97%. Rt will continue to monitor.

## 2014-04-24 NOTE — Progress Notes (Signed)
OT Cancellation Note  Patient Details Name: Courtney CorwinJoanne Greene MRN: [id number] DOB: 1961-09-01   Cancelled Treatment:    Reason Eval/Treat Not Completed: Patient's level of consciousness;Patient not medically ready. Per note, MD discussing comfort care with family. Will sign off. If pt progresses and therapeutic intervention is warrented, please reorder. Tank you.  Lorinda CreedHilary S Rosaria Kubin PanamaHilary Caylee Vlachos, [place] CarolinaOTR/L  [phone] 04/24/2014 04/24/2014, 9:43 AM

## 2014-04-24 NOTE — Progress Notes (Signed)
Dr Molli KnockYacoub notifed of pt with increasing tachycardia and tachypnea and loss of gag and cough reflex. Orders placed to start labetalol drip and titrate off the cardene

## 2014-04-25 ENCOUNTER — Inpatient Hospital Stay (HOSPITAL_COMMUNITY): Payer: Medicaid Other

## 2014-04-25 DIAGNOSIS — Z66 Do not resuscitate: Secondary | ICD-10-CM

## 2014-04-25 LAB — CBC
HEMATOCRIT: 30.8 % — AB (ref 36.0–46.0)
Hemoglobin: 9.5 g/dL — ABNORMAL LOW (ref 12.0–15.0)
MCH: 23.9 pg — ABNORMAL LOW (ref 26.0–34.0)
MCHC: 30.8 g/dL (ref 30.0–36.0)
MCV: 77.4 fL — ABNORMAL LOW (ref 78.0–100.0)
Platelets: 212 10*3/uL (ref 150–400)
RBC: 3.98 MIL/uL (ref 3.87–5.11)
RDW: 22.6 % — AB (ref 11.5–15.5)
WBC: 21.5 10*3/uL — AB (ref 4.0–10.5)

## 2014-04-25 LAB — GLUCOSE, CAPILLARY
GLUCOSE-CAPILLARY: 173 mg/dL — AB (ref 70–99)
GLUCOSE-CAPILLARY: 135 mg/dL — AB (ref 70–99)
Glucose-Capillary: 136 mg/dL — ABNORMAL HIGH (ref 70–99)

## 2014-04-25 LAB — BASIC METABOLIC PANEL
BUN: 23 mg/dL (ref 6–23)
CO2: 19 meq/L (ref 19–32)
Calcium: 9.2 mg/dL (ref 8.4–10.5)
Chloride: 109 mEq/L (ref 96–112)
Creatinine, Ser: 0.71 mg/dL (ref 0.50–1.10)
GFR calc non Af Amer: >90 mL/min (ref >90)
Glucose, Bld: 182 mg/dL — ABNORMAL HIGH (ref 70–99)
POTASSIUM: 3.2 meq/L — AB (ref 3.7–5.3)
Sodium: 142 mEq/L (ref 137–147)

## 2014-04-25 LAB — PHOSPHORUS: Phosphorus: 2.9 mg/dL (ref 2.3–4.6)

## 2014-04-25 LAB — MAGNESIUM: MAGNESIUM: 1.8 mg/dL (ref 1.5–2.5)

## 2014-04-25 MED ORDER — POTASSIUM CHLORIDE 20 MEQ/15ML (10%) PO LIQD
30.0000 meq | ORAL | Status: DC
  Filled 2014-04-25 (×2): qty 22.5

## 2014-04-25 MED ORDER — MORPHINE BOLUS VIA INFUSION
5.0000 mg | INTRAVENOUS | Status: DC | PRN
  Filled 2014-04-25: qty 20

## 2014-04-25 MED ORDER — MORPHINE SULFATE 10 MG/ML IJ SOLN
10.0000 mg/h | INTRAVENOUS | Status: DC
  Administered 2014-04-25: 10 mg/h via INTRAVENOUS
  Filled 2014-04-25: qty 10

## 2014-04-26 LAB — URINE CULTURE

## 2014-04-27 LAB — CULTURE, BLOOD (ROUTINE X 2)

## 2014-04-27 LAB — CULTURE, RESPIRATORY

## 2014-04-30 NOTE — Discharge Summary (Signed)
Patient ID: Courtney Greene MRN: [id number] DOB/AGE: [birth year] 53 y.o.  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 Patient made DO NOT RESUSCITATE and comfort care by family  Pertinent Medical Diagnosis: Active Problems:   ICH (intracerebral hemorrhage)   Hypertensive emergency   Generalized seizures   Acute respiratory failure   Cytotoxic brain edema   Obstructive hydrocephalus   DNR (do not resuscitate)   Hospital Course: Courtney CorwinJoanne Foody is an 53 y.o. female with a history of hypertension who was brought to the emergency room and code stroke status following onset of right-sided weakness followed by increasing obtundation and onset of generalized seizure activity. She was last known well at 1:30 PM today. On arrival in the emergency room she was unresponsive and exhibiting generalized seizure activity as well as nausea and vomiting. Patient was intubated for airway protection and placed on mechanical ventilation. She was also given 2 mg of Ativan intravenously with no recurrence of seizure activity. CT scan of her head showed a large left thalamic hemorrhage with extension into lateral ventricles, with a large amount of blood as well and third and fourth ventricles. Blood pressure was markedly elevated. She reportedly had blood pressure of 300/150 in route to the emergency room. Blood pressure recorded in the emergency room was maximum at 227/150. NIH stroke score was 28. Patient had no history of stroke or TIA, no history of seizure activity. The patient was intubated for airway protection and required IV medication for blood pressure control as well as sedation as she she was quite restless and tachycardic. Her neurological exam is quite poor and she was comatose and  unresponsive with no response to verbal stimuli and minimum response to noxious stimuli. The patient's prognosis was felt to be quite poor. After multiple discussions with family members they understood the poor prognosis and wanted to 1 hours the patient's wishes. Neurosurgery was consulted and did not feel patient would benefit with IVC placement. The patient was made DO NOT RESUSCITATE and comfort care upon family's consent. Patient's family member for the opportunity to say goodbye. Patient was placed on ventilator withdrawal protocol and extubated as per protocol and passed away quickly.  CT of the brain  04/22/2014 No significant change in the left periventricularthalamic hemorrhage with intraventricular extension, 6 mm left-to-right midline shift and hydrocephalus.  04/22/2014 1. Large left periventricular/ left thalamic hemorrhage with intraventricular extension. Overall volume of intraventricular hemorrhage is slightly decreased relative to 04/24/2014, compatible with redistribution. Stable ventricular dilatation. 2. Left-to-right midline shift of approximately 7 mm, not significantly changed.  05/04/2014 1. Large left periventricular, left thalamic hemorrhage with rupture into the ventricles. A large amount of blood is noted in the third, lateral, and fourth ventricles. Midline shift from left-to-right of approximately 8 mm noted. Diffuse cerebral edema present. 2. Opacification of the right frontal, right maxillary, and sphenoid sinuses consistent with sinusitis.      Signed: Micki Rileyramod S Sethi 04/30/2014, 4:30 PM

## 2014-05-07 ENCOUNTER — Telehealth: Payer: Self-pay | Admitting: *Deleted

## 2014-05-07 NOTE — Telephone Encounter (Signed)
Wrong pt opened.

## 2014-05-21 NOTE — Progress Notes (Signed)
Pronounced time of death 2005. Two nurses auscultated for heart and breath sounds for 2 minutes. None present. Paged Dr. Cyril Mourningamillo for notification of time of death. Peripheral IV's and foley removed. Pt transported to morgue.

## 2014-05-21 NOTE — Progress Notes (Signed)
CRITICAL VALUE ALERT  Critical value received:Blood Culture gram + cocci and chains  Date of notification:04/23/2014  Time of notification:  0136  Critical value read back:yes  Nurse who received alert:  Tedra CoupeLauren Kiser, RN  MD notified (1st page):  Dr. Darrick Pennaeterding  Time of first page:  0136  MD notified (2nd page):  Time of second page:  Responding MD:  Dr. Darrick Pennaeterding  Time MD responded:  [phone]

## 2014-05-21 NOTE — Progress Notes (Signed)
Stroke Team Progress Note  HISTORY Courtney Greene is an 53 y.o. female with a history of hypertension who was brought to the emergency room in code stroke status following onset of right-sided weakness followed by increasing obtundation and onset of generalized seizure activity. She was last known well at 1:30 PM today 05/14/2014. On arrival in the emergency room she was unresponsive and exhibiting generalized seizure activity as well as nausea and vomiting. Patient was intubated for airway protection and placed on mechanical ventilation. She was also given 2 mg of Ativan intravenously with no recurrence of seizure activity. CT scan of her head showed a large left thalamic hemorrhage with extension into lateral ventricles, with a large amount of blood as well and third and fourth ventricles. Blood pressure was markedly elevated. She reportedly had blood pressure of 300/150 in route to the emergency room. Blood pressure recorded in the emergency room was maximum at 227/150. NIH stroke score was 28. Patient had no history of stroke or TIA, no history of seizure activity.Patient was not administered TPA secondary to ICH. She was admitted to the neuro ICU for further evaluation and treatment.  SUBJECTIVE No family at bedside. Reported loss of gag yesterday.  OBJECTIVE Most recent Vital Signs: Filed Vitals:   05/11/2014 0600 05/20/2014 0700 05/11/2014 0800 05/01/2014 0802  BP: 120/74 122/77 128/72 128/72  Pulse: 97 99 103 103  Temp:   100.6 F (38.1 C)   TempSrc:   Rectal   Resp: 30 27 28 26   Height:      Weight:      SpO2: 97% 98% 98% 98%   CBG (last 3)   Recent Labs  04/24/14 1956 04/24/14 2339 04/28/2014 0346  GLUCAP 154* 134* 173*    IV Fluid Intake:   . labetalol (NORMODYNE) infusion 1 mg/min (04/28/2014 0700)  . niCARDipine Stopped (04/24/14 1652)  . propofol 30 mcg/kg/min (05/13/2014 0700)    MEDICATIONS  . antiseptic oral rinse  15 mL Mouth Rinse QID  . chlorhexidine  15 mL Mouth Rinse BID   . famotidine (PEPCID) IV  20 mg Intravenous Q12H  . feeding supplement (VITAL HIGH PROTEIN)  1,000 mL Per Tube Q24H  . free water  250 mL Per Tube Q6H  . insulin aspart  0-9 Units Subcutaneous 6 times per day  . levETIRAcetam  1,000 mg Intravenous Q12H  . piperacillin-tazobactam (ZOSYN)  IV  3.375 g Intravenous 3 times per day  . vancomycin  1,000 mg Intravenous Q12H   PRN:  acetaminophen, acetaminophen, fentaNYL, hydrALAZINE, labetalol, metoprolol, midazolam  Diet:     Activity:  Bedrest DVT Prophylaxis:  SCDs  CLINICALLY SIGNIFICANT STUDIES Basic Metabolic Panel:   Recent Labs Lab 04/24/14 0235 05/02/2014 0229  NA 148* 142  K 3.8 3.2*  CL 114* 109  CO2 17* 19  GLUCOSE 161* 182*  BUN 15 23  CREATININE 0.63 0.71  CALCIUM 9.2 9.2  MG 1.9 1.8  PHOS 1.6* 2.9   Liver Function Tests:   Recent Labs Lab 2014-06-27 1407  AST 29  ALT 16  ALKPHOS 106  BILITOT 0.3  PROT 8.8*  ALBUMIN 3.5   CBC:   Recent Labs Lab 2014-06-27 1407  04/24/14 0235 05/12/2014 0229  WBC 14.1*  < > 25.8* 21.5*  NEUTROABS 5.4  --   --   --   HGB 13.4  < > 11.1* 9.5*  HCT 41.3  < > 35.4* 30.8*  MCV 75.2*  < > 76.8* 77.4*  PLT 345  < >  259 212  < > = values in this interval not displayed. Coagulation:   Recent Labs Lab 05/12/2014 1407  LABPROT 12.7  INR 0.97   Cardiac Enzymes: No results found for this basename: CKTOTAL, CKMB, CKMBINDEX, TROPONINI,  in the last 168 hours Urinalysis:   Recent Labs Lab 05/05/2014 1634 04/24/14 0841  COLORURINE YELLOW YELLOW  LABSPEC 1.031* 1.019  PHURINE 6.0 5.0  GLUCOSEU NEGATIVE NEGATIVE  HGBUR MODERATE* MODERATE*  BILIRUBINUR NEGATIVE NEGATIVE  KETONESUR NEGATIVE NEGATIVE  PROTEINUR >300* 30*  UROBILINOGEN 0.2 1.0  NITRITE NEGATIVE POSITIVE*  LEUKOCYTESUR NEGATIVE MODERATE*   Lipid Panel No results found for this basename: chol,  trig,  hdl,  cholhdl,  vldl,  ldlcalc   HgbA1C  No results found for this basename: HGBA1C    Urine Drug Screen:    No results found for this basename: labopia,  cocainscrnur,  labbenz,  amphetmu,  thcu,  labbarb    Alcohol Level: No results found for this basename: ETH,  in the last 168 hours  Abd Portable 05/05/2014   Orogastric tube noted with its tip projected over the distal stomach. No gastric distention .      CT of the brain   04/22/2014   No significant change in the left periventricularthalamic hemorrhage with intraventricular extension, 6 mm left-to-right midline shift and hydrocephalus.    04/22/2014   1. Large left periventricular/ left thalamic hemorrhage with intraventricular extension. Overall volume of intraventricular hemorrhage is slightly decreased relative to 05/17/2014, compatible with redistribution. Stable ventricular dilatation. 2. Left-to-right midline shift of approximately 7 mm, not significantly changed.    04/24/2014   1. Large left periventricular, left thalamic hemorrhage with rupture into the ventricles. A large amount of blood is noted in the third, lateral, and fourth ventricles. Midline shift from left-to-right of approximately 8 mm noted. Diffuse cerebral edema present. 2. Opacification of the right frontal, right maxillary, and sphenoid sinuses consistent with sinusitis.   MRI of the brain    MRA of the brain    2D Echocardiogram  EF 55-60% with no source of embolus.   Carotid Doppler  No evidence of hemodynamically significant internal carotid artery stenosis. Vertebral artery flow is antegrade.   CXR   04/24/2014 Endotracheal tube 6 cm above the carina Progression of diffuse bilateral airspace disease, most suggestive of pulmonary edema however pneumonia could be present. Progressive volume loss in both lung bases.   04/22/2014    1. Satisfactory position of ET tube with tip above the carina    05/13/2014    No acute cardiopulmonary disease.  ET tube well positioned.  Therapy Recommendations   Physical Exam   Patient is intubated and on mechanical ventilation. Propofol  recently turned off. . Afebrile. Head is nontraumatic. Neck is supple without bruit.  . Cardiac exam no murmur or gallop. Lungs are clear to auscultation. Distal pulses are well felt.  Neurological Exam : Comatose and unresponsive. Eyes closed. No response to verbal stimuli, minimal response to noxious stimuli. Not following commands Pupils were 1-83mm and unresponsive, midline, skew gaze deviation, no corneal reflex noted No clear facial weakness was noted. Gag reflex is present Muscle tone was flaccid throughout. With noxious stimuli noted minimal delayed flexion of RUE . DTRs depressed. Plantars both downgoing.    ASSESSMENT Courtney Greene is a 53 y.o. female presenting with right sided weakness. Imaging confirms a large left thalamic ICH with intraventricular extension and 7mm midline shift due to cytotoxic cerebral edema and hydrocephalus. Repeat head  CT is stable. ICH secondary to malignant hypertension. Neurosurgeon Dr. Yetta BarreJones is following; surgical intervention/IVC placement not thought to add benefit; rediscussed yesterday with still no indication. On no antithrombotics prior to admission. Overall prognosis remains poor.    Acute respiratory failure  Placed on the ventilator in the ED  Sedated with propofol  CCM managing  Malignant HTN, BP 300/150, placed on cardene, cardene changed to labetolol 5/5  Seizure, most likely secondary to hemorrhage  Placed on keppra, increased to 1000mg  BID  Dysphagia secondary to hemorrhage  Tube feedings  Hypernatremia  Sepsis  [place]  Leukocytosis  blood cultures gm + cocci in chains  On [facility]  Hospital day # 4  TREATMENT/PLAN  Continue aggressive care for now  Await family decision related to plan of care - they are waiting for family to arrive from out of town  Nothing new to add from stroke standpoint  CCM continue to manage medical issues  Annie MainSHARON BIBY, MSN, RN, ANVP-BC, ANP-BC, GNP-BC [facility] Pager: [phone] 05/07/2014 8:19 AM  This patient is critically ill and at significant risk of neurological worsening, death and care requires constant monitoring of vital signs, hemodynamics,respiratory and cardiac monitoring,review of multiple databases, neurological assessment, discussion with family, other specialists and medical decision making of high complexity. I spent 30 minutes of neurocritical care time  in the care of  this patient. I have personally obtained a history, examined the patient, evaluated imaging results, and formulated the assessment and plan of care. I agree with the above.  Elspeth ChoPeter Tavarus Poteete, DO Neurology-Stroke [facility] Pager: [phone]   To contact Stroke Continuity provider, please refer to [email or web address]. After hours, contact General Neurology

## 2014-05-21 NOTE — Progress Notes (Signed)
PULMONARY / CRITICAL CARE MEDICINE   Name: Courtney CorwinJoanne Greene MRN: [id number] DOB: 12-22-1960    ADMISSION DATE:  05/03/2014 CONSULTATION DATE:  05/19/2014   REFERRING MD :  Neurology  PRIMARY SERVICE: Neurology   BRIEF PATIENT DESCRIPTION: 53 y/o F admitted to Stroke Service. with thalamic hemorrhagic CVA with IVH, hydrocephalus and cerebral edema.    SIGNIFICANT EVENTS: 5/2 - admit with large ICH  STUDIES:  5/02 - CT HEAD: large left periventricular, L thalamic hemorrhage with rupture into the ventricles.  Blood noted in third, lateral & 4th ventricles. 8 mm midline shift.  R maxillary, frontal & sphenoid sinusitis 5/02 - ECHO: 5/02 - Carotid Doppler: 5/03 CT head: Overall volume of intraventricular hemorrhage is slightly decreased relative to 05/09/2014, compatible with redistribution. Stable ventricular dilatation 5/03 CT head: NSC  LINES / TUBES: ETT 5/2 >> PIV  CULTURES: Blood 5/5>>> Urine 5/5>>> Sputum 5/5>>>  ANTIBIOTICS: Vancomycin 5/5>>> Zosyn 5/5>>>  SUBJECTIVE:  Comatose, fever overnight.  VITAL SIGNS: Temp:  [96 F (35.6 C)-100.6 F (38.1 C)] 100.6 F (38.1 C) (05/06 0800) Pulse Rate:  [86-137] 103 (05/06 0802) Resp:  [20-35] 26 (05/06 0802) BP: (111-197)/(69-96) 128/72 mmHg (05/06 0802) SpO2:  [91 %-99 %] 98 % (05/06 0802) FiO2 (%):  [40 %-100 %] 100 % (05/06 0802)  HEMODYNAMICS:    VENTILATOR SETTINGS: Vent Mode:  [-] PRVC FiO2 (%):  [40 %-100 %] 100 % Set Rate:  [20 bmp] 20 bmp Vt Set:  [450 mL] 450 mL PEEP:  [5 cmH20] 5 cmH20 Plateau Pressure:  [10 cmH20-22 cmH20] 18 cmH20  INTAKE / OUTPUT: Intake/Output     05/05 0701 - 05/06 0700 05/06 0701 - 05/07 0700   I.V. (mL/kg) 1195.6 (15.3)    NG/GT 2190    IV Piggyback 920    Total Intake(mL/kg) 4305.6 (55.2)    Urine (mL/kg/hr) 1925 (1)    Total Output 1925     Net +2380.6           PHYSICAL EXAMINATION: General: Unresponsive Neuro: Comatose, ? posturing HEENT: WNL Cardiovascular: RRR s  M Lungs: clear Abdomen: soft, +BS Ext:  Warm, no edema   LABS: I have reviewed all of today's lab results. 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 - RN informed me yesterday that family will be getting ready for withdrawal today.  PULMONARY A: Acute respiratory failure  P:   - Cont full vent support - settings reviewed and/or adjusted. - Cont vent bundle. - Likely bilateral pneumonias now, likely aspiration, see ID section. - Once family is confirmed ready then will withdraw.  CARDIOVASCULAR A:  Hypertensive Emergency  - controlled Tachycardia - resolved P:  - Cont labetalol gtt until ready for withdraw.  RENAL A:   Mild hypernatremia P:   - Monitor BMET intermittently. - Monitor I/Os. - Correct electrolytes as indicated. - Free water for hypernatremia. - If family is ready for withdraw then will d/c further blood draws.  GASTROINTESTINAL A:   No acute issues P:   - SUP: Famotidine. - Continue TF until ready for withdrawal.  HEMATOLOGIC A:   No issues P:  - DVT px: SCDs, d/c heparin. - Monitor CBC intermittently.  INFECTIOUS A:   ?bilateral PNA at this point. P:   - Monitor fever curve / leukocytosis. - Pan culture. -  Vanc/zosyn until confirmed ready for withdrawal.  ENDOCRINE A:   Hyperglycemia without prior dx of DM P:   - Cont SSI.  Awaiting family arrival then if ready for withdrawal then will enter orders, in the meantime, continue current care as above.  I have personally obtained a history, examined the patient, evaluated laboratory and imaging results, formulated the assessment and plan and placed orders.  CRITICAL CARE: The patient is critically ill with multiple organ systems failure and requires high complexity decision making for  assessment and support, frequent evaluation and titration of therapies, application of advanced monitoring technologies and extensive interpretation of multiple databases. Critical Care Time devoted to patient care services described in this note is 35 minutes.   Alyson ReedyWesam G. Shevonne Wolf, M.D. [facility] Medicine. Pager: [phone]. After hours pager: [phone].

## 2014-05-21 NOTE — Progress Notes (Signed)
Per MD order- RT followed Guideline for Withdrawal of Life-Sustaining Treatment. Uneventful- RN and family at bedside.

## 2014-05-21 NOTE — Progress Notes (Signed)
Spoke with daughter at length, after discussion, she informed me that her father's plane is landing right now.  Will place withdrawal orders and when everybody is present will proceed with withdrawal.  Will leave order.  CC time 45 min.  Alyson ReedyWesam G. Jeet Shough, M.D. [facility] Pulmonary/Critical Care Medicine. Pager: [phone]. After hours pager: [phone].

## 2014-05-21 NOTE — Progress Notes (Signed)
[facility] ICU Electrolyte Replacement Protocol  Patient Name: Nanda Bittick DOB: [birth year] MRN: [id number]  Date of Service  05-02-2014   HPI/Events of Note    Recent Labs Lab 05/13/2014 1407 04/24/2014 1432 04/22/14 0245 04/24/14 0235 2014/05/02 0229  NA 141 142 139 148* 142  K 2.9* 4.2 3.5* 3.8 3.2*  CL 101 107 105 114* 109  CO2 17*  --  20 17* 19  GLUCOSE 165* 200* 149* 161* 182*  BUN 20 28* _0 CREATININE 0.78 1.10 0.71 0.63 0.71  CALCIUM 10.6*  --  9.3 9.2 9.2  MG  --   --   --  1.9 1.8  PHOS  --   --   --  1.6* 2.9    Estimated Creatinine Clearance: 85.9 ml/min (by C-G formula based on Cr of 0.71).  Intake/Output     05/05 0701 - 05/06 0700   I.V. (mL/kg) 1137.6 (14.6)   NG/GT 2070   IV Piggyback 870   Total Intake(mL/kg) 4077.6 (52.3)   Urine (mL/kg/hr) 1880 (1)   Total Output 1880   Net +2197.6        - I/O DETAILED x24h    Total I/O In: 1927.6 [I.V.:417.6; NG/GT:1100; IV Piggyback:410] Out: 550 [Urine:550] - I/O THIS SHIFT    ASSESSMENT   eICURN Interventions  K+ 3.2 Electrolyte protocol criteria met. Lab values replaced per protocol. MD notified.   ASSESSMENT: MAJOR ELECTROLYTE    Lorene Dy May 02, 2014, 6:05 AM

## 2014-05-21 DEATH — deceased

## 2015-03-10 IMAGING — CT CT HEAD W/O CM
2 series · 16 of 30 positions shown, 20 images · non-contrast
Comparison: 04/22/2014 and 04/21/2014 CTs

CLINICAL DATA: 52-year-old female-followup intracranial hemorrhage.

EXAM:
CT HEAD WITHOUT CONTRAST
TECHNIQUE: Contiguous axial images were obtained from the base of the skull
through the vertex without intravenous contrast.

[Series 201: head w/o, idose (1) · axial · non-contrast · 0.44mm/px · z∈[+121,+241]mm · 13 of 28 slices shown, 17 images]
[im 2/28  brain]
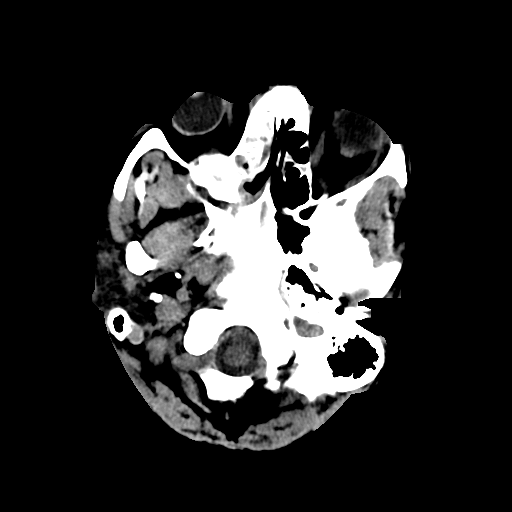
[im 2/28  bone]
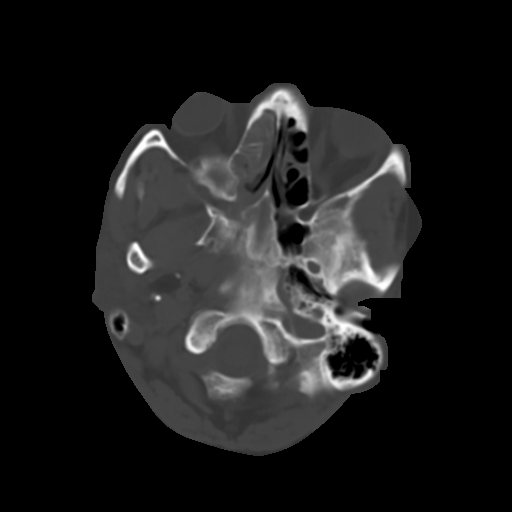
[im 4/28  brain]
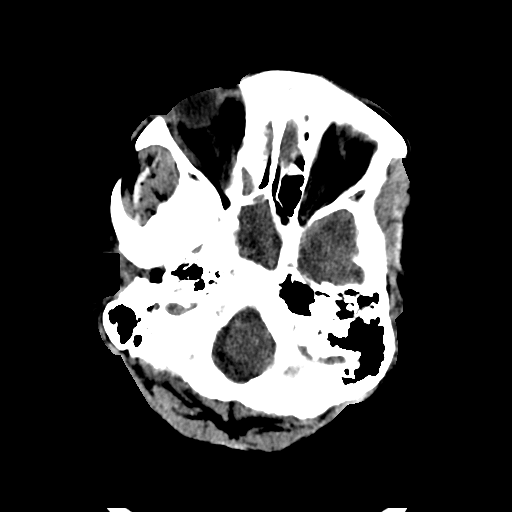
[im 6/28  brain]
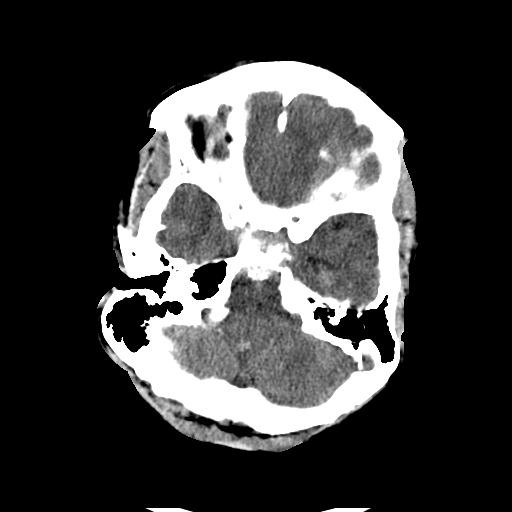
[im 8/28  brain]
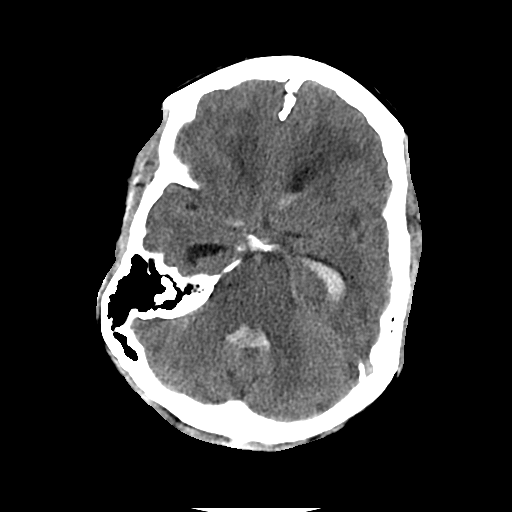
[im 10/28  brain]
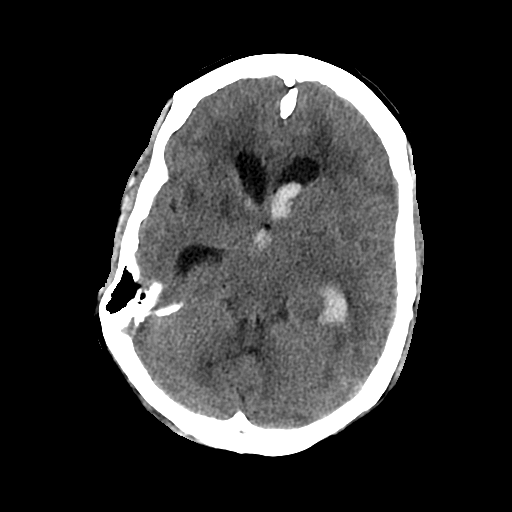
[im 10/28  bone]
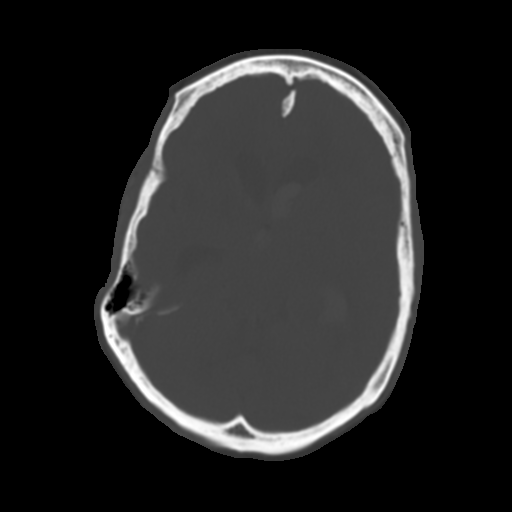
[im 12/28  brain]
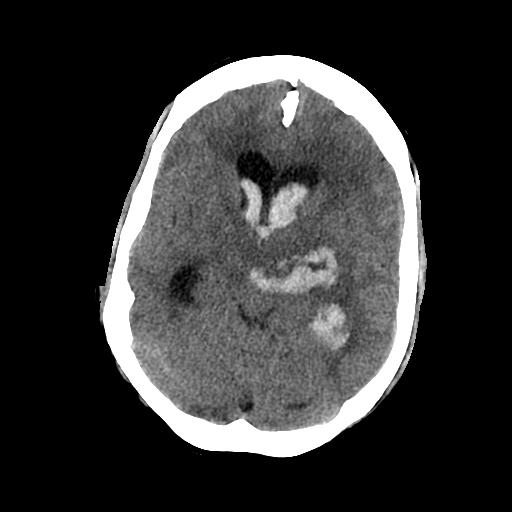
[im 14/28  brain]
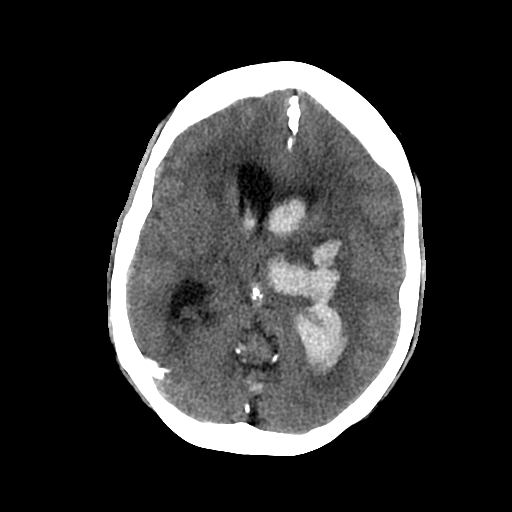
[im 16/28  brain]
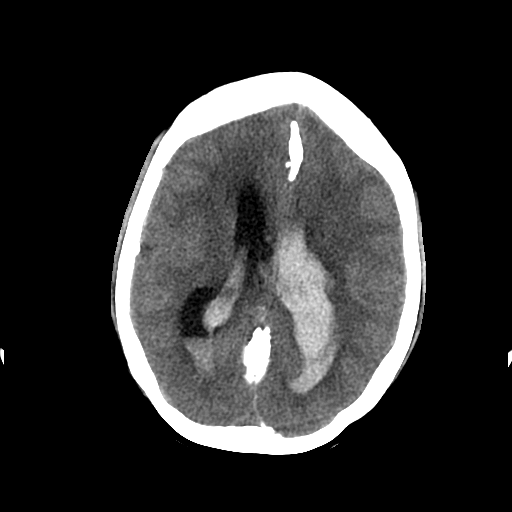
[im 18/28  brain]
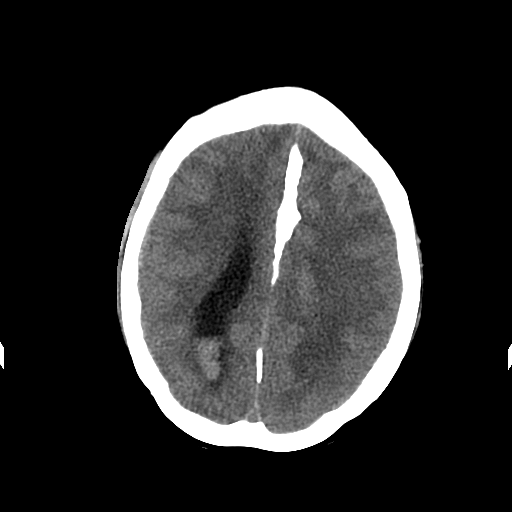
[im 18/28  bone]
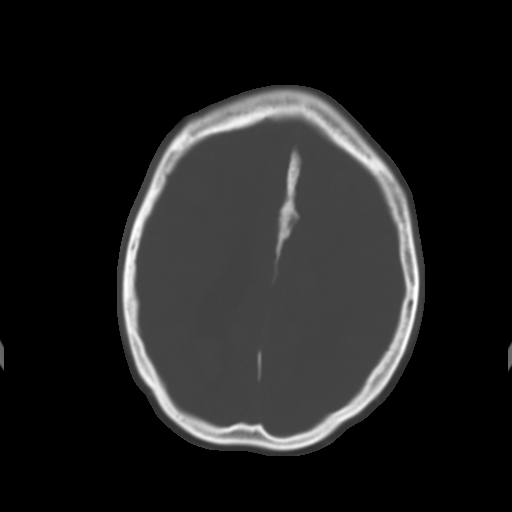
[im 20/28  brain]
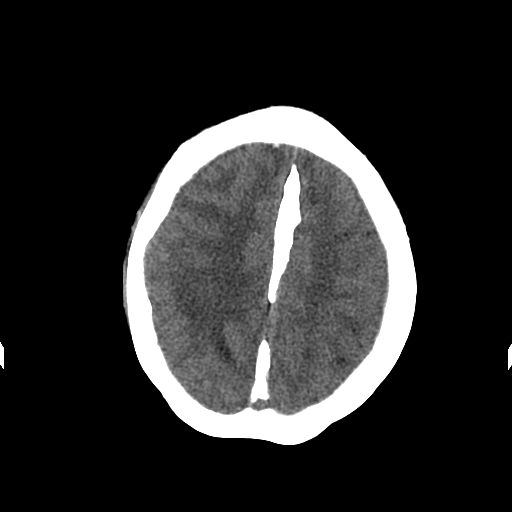
[im 22/28  brain]
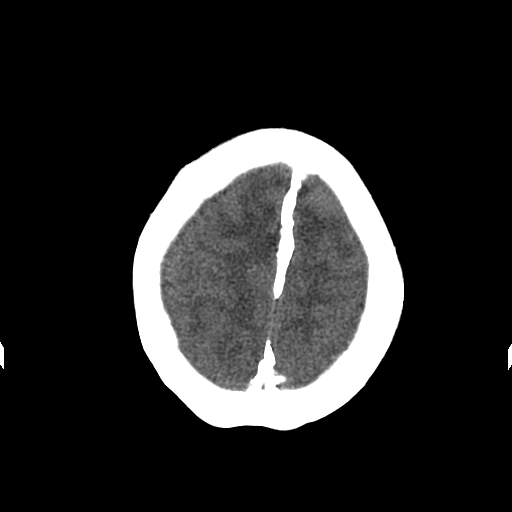
[im 24/28  brain]
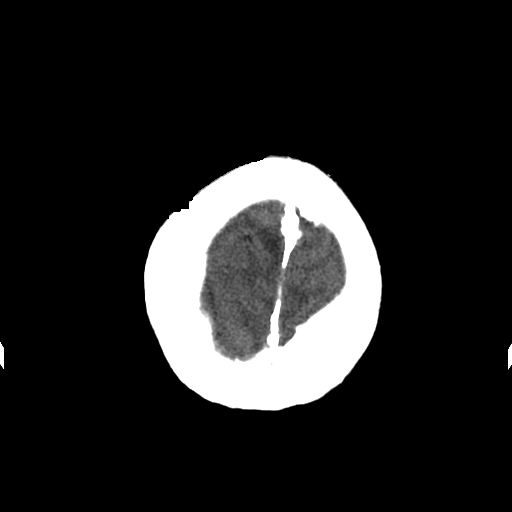
[im 26/28  brain]
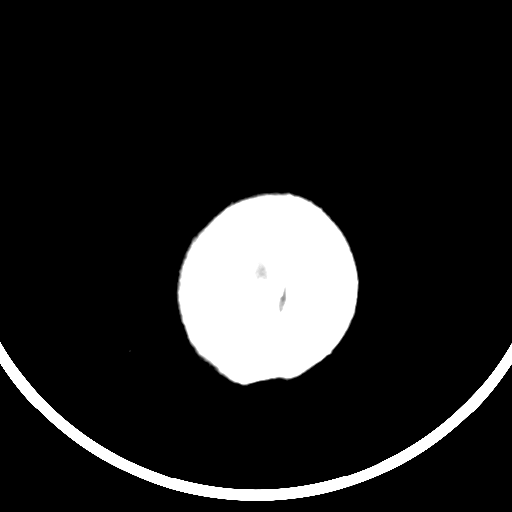
[im 26/28  bone]
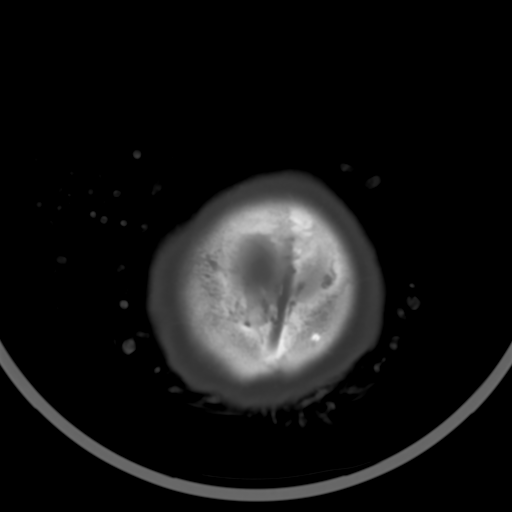

[Series 202: head w/o bone, idose (1) · axial · non-contrast · 0.44mm/px · z∈[+121,+161]mm · 3 of 28 slices shown]
[im 2/28  bone]
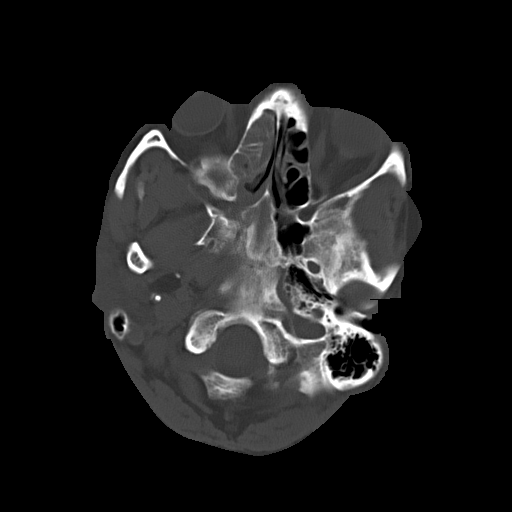
[im 6/28  bone]
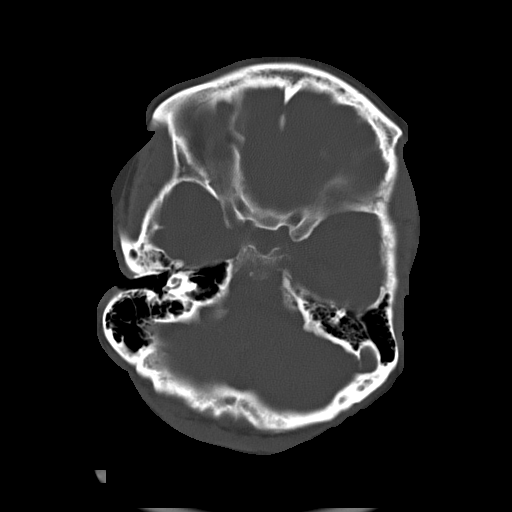
[im 10/28  bone]
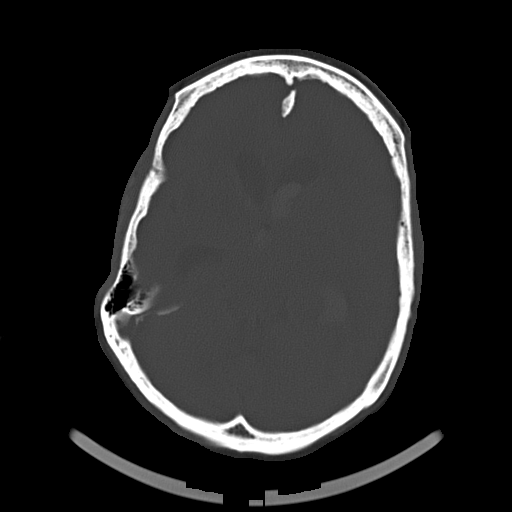

[16 of 30 positions shown; findings below may reference images not displayed]

FINDINGS: Left periventricular/thalamic hemorrhage with intraventricular
extension (lateral, third and fourth ventricles) is again noted and
unchanged from the prior study. 6 mm left to right midline shift has
not significantly changed.

Hydrocephalus has a similar appearance to prior study.

There is no evidence of new hemorrhage or infarction.

A opacification of the right paranasal sinuses are again noted.
IMPRESSION: No significant change in the left periventricularthalamic hemorrhage
with intraventricular extension, 6 mm left-to-right midline shift
and hydrocephalus.
